# Patient Record
Sex: Male | Born: 1955 | Race: White | Hispanic: No | Smoking: Never smoker
Health system: Southern US, Community
[De-identification: ages and names within clinical notes are randomized; demographics above are authoritative.]

## PROBLEM LIST (undated history)

## (undated) DIAGNOSIS — G4730 Sleep apnea, unspecified: Secondary | ICD-10-CM

## (undated) DIAGNOSIS — R0989 Other specified symptoms and signs involving the circulatory and respiratory systems: Secondary | ICD-10-CM

## (undated) DIAGNOSIS — K219 Gastro-esophageal reflux disease without esophagitis: Secondary | ICD-10-CM

## (undated) DIAGNOSIS — I10 Essential (primary) hypertension: Secondary | ICD-10-CM

## (undated) DIAGNOSIS — E119 Type 2 diabetes mellitus without complications: Secondary | ICD-10-CM

## (undated) DIAGNOSIS — Z972 Presence of dental prosthetic device (complete) (partial): Secondary | ICD-10-CM

## (undated) DIAGNOSIS — M1990 Unspecified osteoarthritis, unspecified site: Secondary | ICD-10-CM

## (undated) HISTORY — PX: OTHER SURGICAL HISTORY: SHX169

---

## 1993-11-04 DIAGNOSIS — R569 Unspecified convulsions: Secondary | ICD-10-CM

## 1993-11-04 DIAGNOSIS — I671 Cerebral aneurysm, nonruptured: Secondary | ICD-10-CM

## 1993-11-04 HISTORY — DX: Cerebral aneurysm, nonruptured: I67.1

## 1993-11-04 HISTORY — DX: Unspecified convulsions: R56.9

## 1994-11-04 HISTORY — PX: ANEURYSM COILING: SHX5349

## 2000-01-02 ENCOUNTER — Encounter: Payer: Self-pay | Admitting: Emergency Medicine

## 2000-01-02 ENCOUNTER — Inpatient Hospital Stay (HOSPITAL_COMMUNITY): Admission: EM | Admit: 2000-01-02 | Discharge: 2000-01-16 | Payer: Self-pay | Admitting: Emergency Medicine

## 2000-01-03 ENCOUNTER — Encounter: Payer: Self-pay | Admitting: Neurosurgery

## 2000-01-04 ENCOUNTER — Encounter: Payer: Self-pay | Admitting: Neurosurgery

## 2000-01-10 ENCOUNTER — Encounter: Payer: Self-pay | Admitting: Neurosurgery

## 2000-03-28 ENCOUNTER — Ambulatory Visit (HOSPITAL_COMMUNITY): Admission: RE | Admit: 2000-03-28 | Discharge: 2000-03-28 | Payer: Self-pay | Admitting: Neurosurgery

## 2000-03-28 ENCOUNTER — Encounter: Payer: Self-pay | Admitting: Neurosurgery

## 2001-04-28 IMAGING — XA IR ANGIO/VERTEBRAL*R*
1 series · 12 of 24 positions shown · non-contrast
Comparison: none

FINDINGS
CLINICAL DATA: PATIENT WITH HEADACHES.  PREVIOUS HISTORY OF ENDOVASCULAR  OCCLUSION OF THE RIGHT
PARACAVERNOUS ANEURYSM.
CEREBRAL ARTERIOGRAM:
FOLLOWING A FULL EXPLANATION OF THE PROCEDURE ALONG WITH THE POTENTIALLY ASSOCIATED COMPLICATIONS,
AN INFORMED WITNESSED CONSENT WAS OBTAINED.  THE RIGHT GROIN WAS PREPPED AND DRAPED IN THE USUAL
STERILE FASHION.  THEREAFTER USING A MODIFIED SELDINGER TECHNIQUE, TRANSFEMORAL ACCESS INTO THE
RIGHT COMMON FEMORAL ARTERY WAS OBTAINED WITHOUT DIFFICULTY.  A 6-FRENCH PINNACLE SHEATH WAS THEN
INSERTED.  THROUGH THIS AND OVER A 0.035 INCH GUIDE WIRE, A 5-FRENCH JB-1 CATHETER WAS ADVANCED
INTO THE AORTIC ARCH REGION AND SELECTIVE CANNULATION ARTERIOGRAMS WERE PERFORMED OF THE RIGHT
COMMON CAROTID ARTERY, THE RIGHT VERTEBRAL ARTERY AND THE LEFT COMMON CAROTID ARTERY.  THERE WERE
NO ACUTE COMPLICATIONS AND THE PATIENT TOLERATED THE PROCEDURE WELL.
MEDICATIONS UTILIZED:  VERSED 1 MG IV.

[Series 1: run · 12 of 116 slices shown]
[im 6/116]
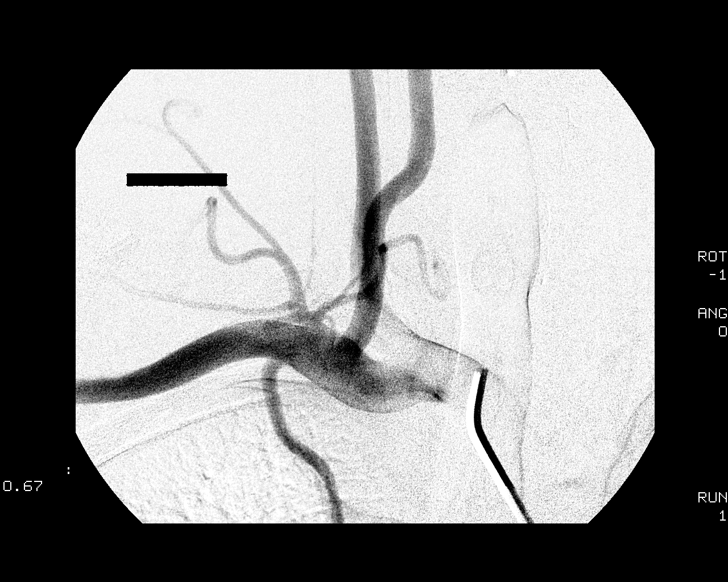
[im 16/116]
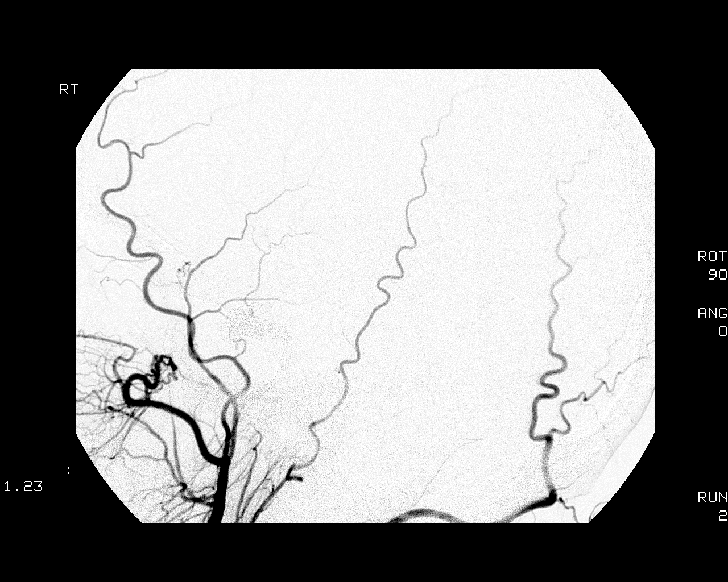
[im 26/116]
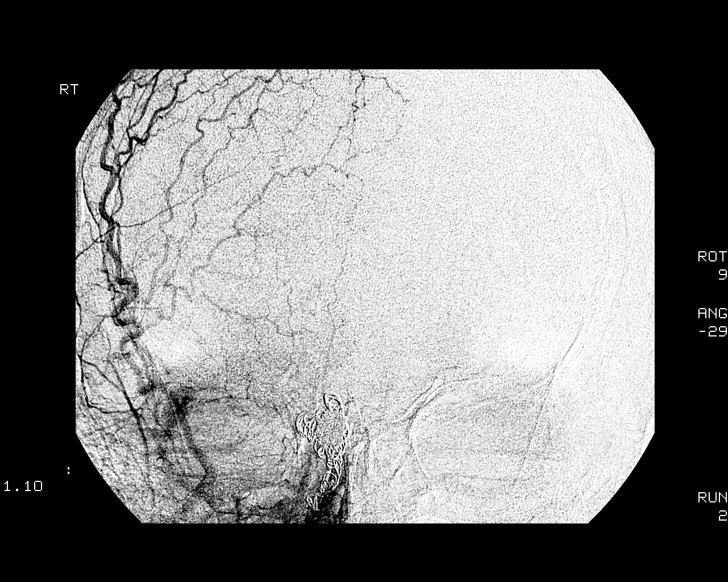
[im 36/116]
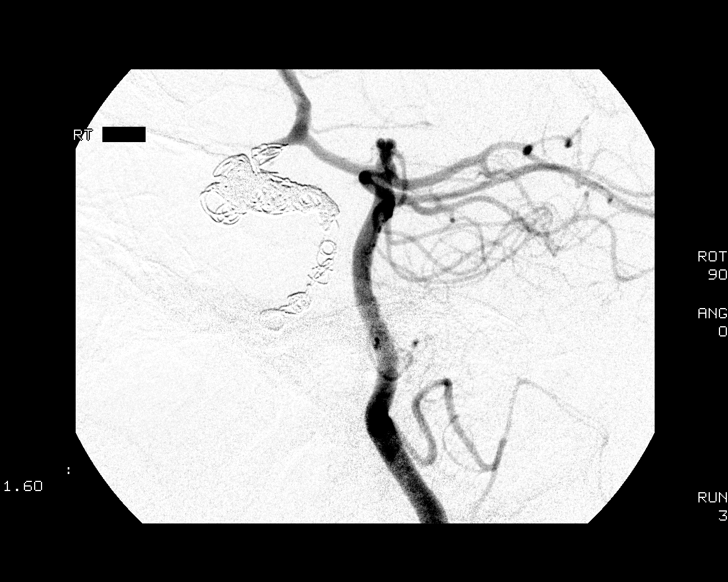
[im 46/116]
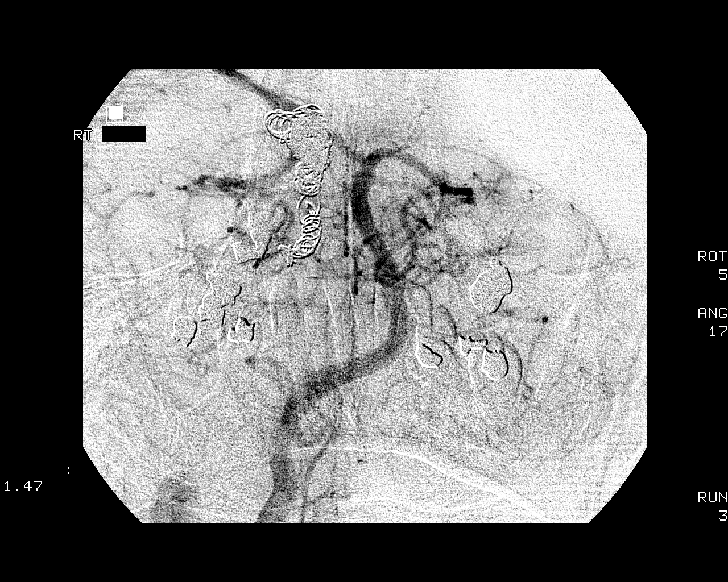
[im 56/116]
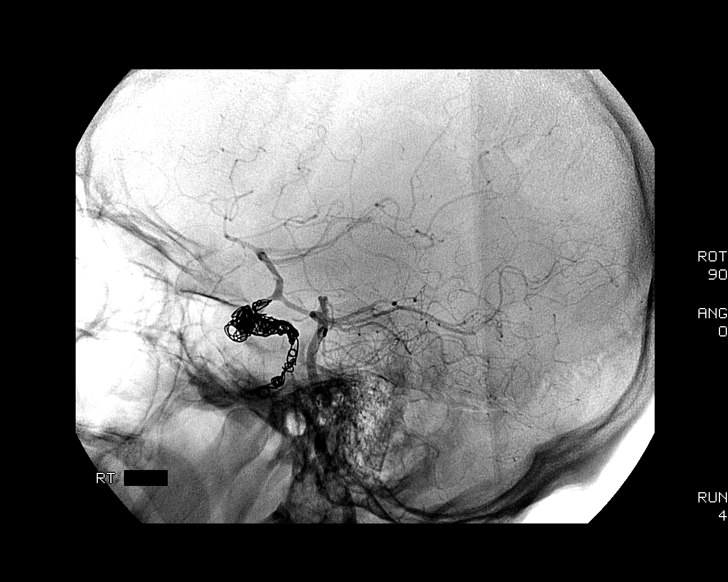
[im 66/116]
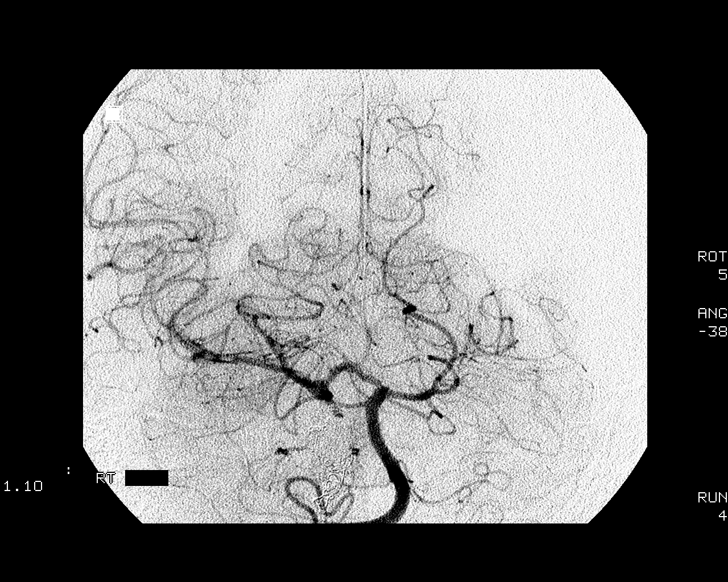
[im 76/116]
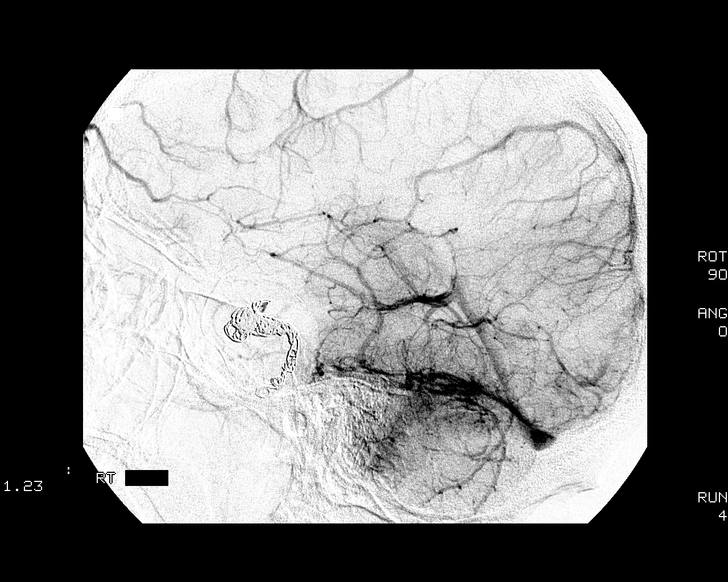
[im 86/116]
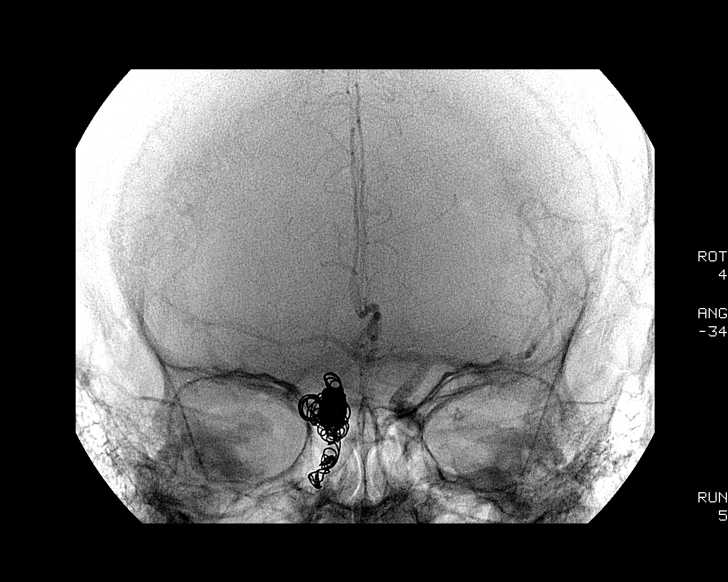
[im 96/116]
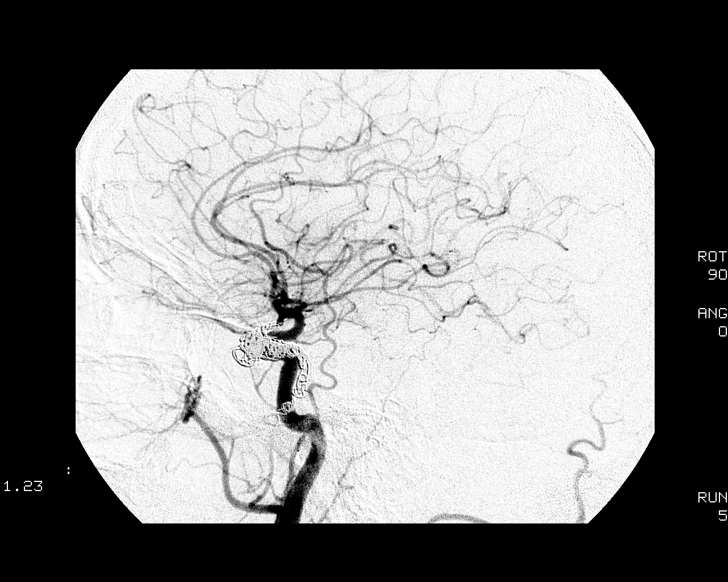
[im 106/116]
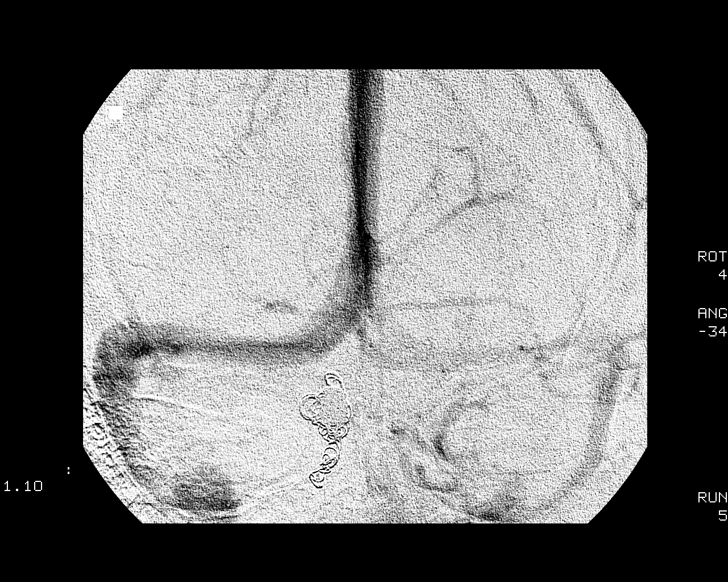
[im 116/116]
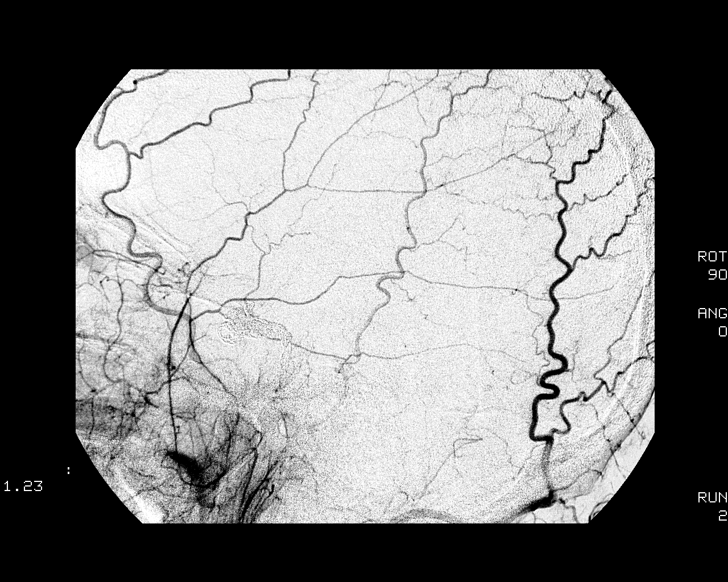

[12 of 24 positions shown; findings below may reference images not displayed]

FINDINGS: THE RIGHT COMMON CAROTID ARTERIOGRAM DEMONSTRATES COMPLETE OCCLUSION OF THE RIGHT
INTERNAL CAROTID ARTERY.   CAROTID ARTERY ORIGIN AND BRANCHES ARE NORMALLY OPACIFIED.  THERE IS NO
RETROGRADE OPACIFICATION VIA THE IPSILATERAL OPHTHALMIC ARTERY OF THE CAVERNOUS SEGMENT OF THE
RIGHT INTERNAL CAROTID ARTERY.
THE RIGHT VERTEBRAL ARTERY ORIGIN IS NORMAL.  THE VESSEL ASCENDS NORMALLY THROUGH THE CRANIAL SKULL
BASE.  THE BASILAR ARTERY, THE POSTERIOR CEREBRAL ARTERIES AND SUPERIOR CEREBRAL ARTERIES AND THE
ANTERIOR INFERIOR CEREBELLAR ARTERIES ARE NORMALLY OPACIFIED INTO THE CAPILLARY AND THE VENOUS
PHASES.  THERE IS A LARGE RIGHT P-COM WHICH RETROGRADELY OPACIFIES THE SUPRACLINOID SEGMENT OF THE
RIGHT ICA AND SUBSEQUENTLY THE RIGHT MCA AND ACA TERRITORIES.  THERE IS RETROGRADE OPACIFICATION TO
THE LEVEL OF THE OPHTHALMIC ARTERY.  NO OPACIFICATION IS NOTED OF THE ANEURYSM OR OF THE MORE
PROXIMAL OCCLUDED INTERNAL CAROTID ARTERY WITH THE COILS.
THE LEFT COMMON CAROTID ARTERIOGRAM DEMONSTRATES THE LEFT MIDDLE AND THE LEFT ANTERIOR CEREBRAL
ARTERIES TO BE NORMALLY OPACIFIED INTO THE CAPILLARY AND THE VENOUS PHASES.  THERE IS CROSS
OPACIFICATION VIA THE A-COM OF THE RIGHT MCA AND ACA TERRITORIES  SIMULTANEOUS WITH OPACIFICATION
OF THE LEFT ACA AND THE LEFT MCA.  NO RETROGRADE OPACIFICATION OF THE SUPRACLINOID SEGMENT ON THE
RIGHT IS NOTED.
IMPRESSION
1.  STATUS POST ENDOVASCULAR OCCLUSION OF THE RIGHT CAVERNOUS AND PETROUS ICA, RIGHT PARACAVERNOUS
ANEURYSM WITH COILS WITHOUT ANGIOGRAPHIC OPACIFICATION OF THE OCCLUDED SEGMENTS OF THE VESSEL AND
THE ANEURYSM.
2.  RETROGRADE OPACIFICATION OF THE OPHTHALMIC ARTERY FROM THE RIGHT VERTEBRAL ARTERY VIA THE P-COM.
3.  RIGHT CEREBRAL HEMISPHERE OPACIFIED VIA THE P-COM AND THE A-COM AS DESCRIBED ABOVE.

## 2007-01-14 ENCOUNTER — Emergency Department: Payer: Self-pay | Admitting: Emergency Medicine

## 2007-01-14 ENCOUNTER — Other Ambulatory Visit: Payer: Self-pay

## 2008-02-14 IMAGING — CR DG CHEST 2V
1 series · 2 of 2 positions shown · non-contrast
Comparison: none

REASON FOR EXAM: Shortness of breath,  rm waiting
COMMENTS:

PROCEDURE:     DXR - DXR CHEST PA (OR AP) AND LATERAL  - January 14, 2007  [DATE]
RESULT:     The lung fields are clear. The heart, mediastinal and osseous
structures show no significant abnormalities.

[Series 1: view not recorded · 0.17mm/px · 2 of 2 slices shown]
[im 1/2]
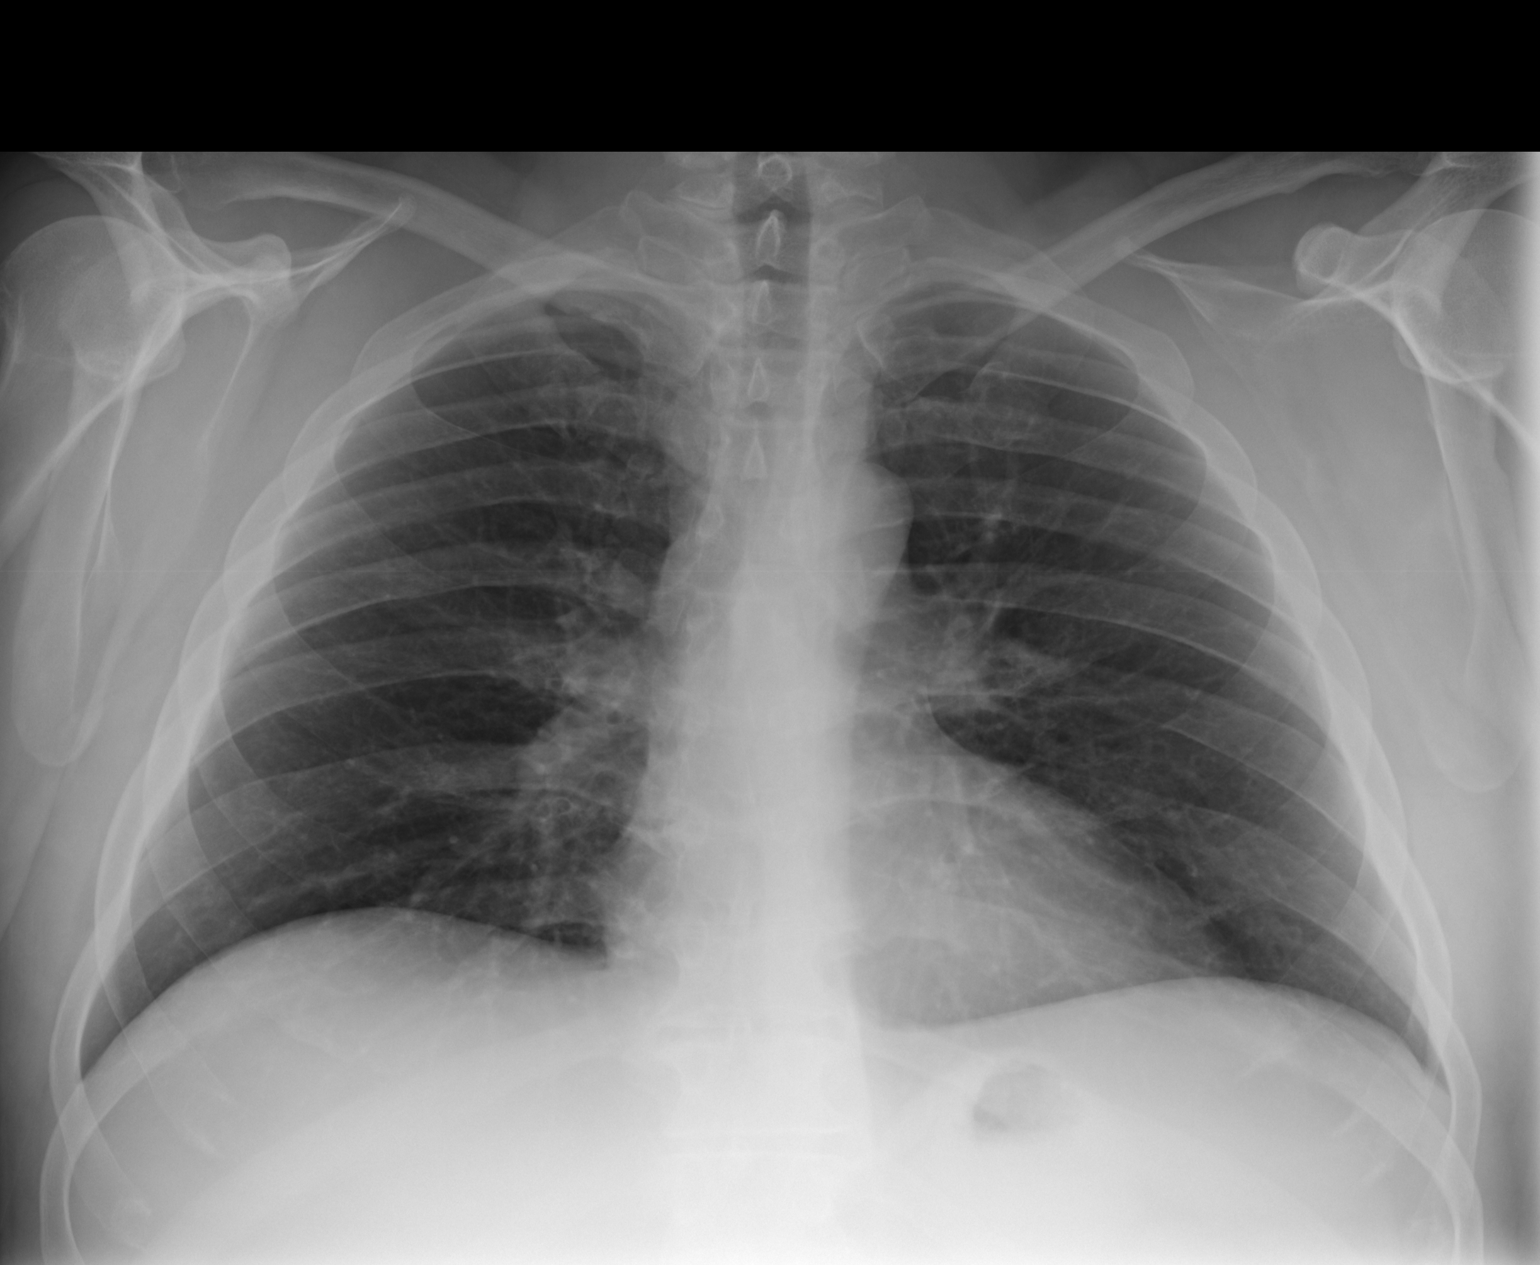
[im 2/2]
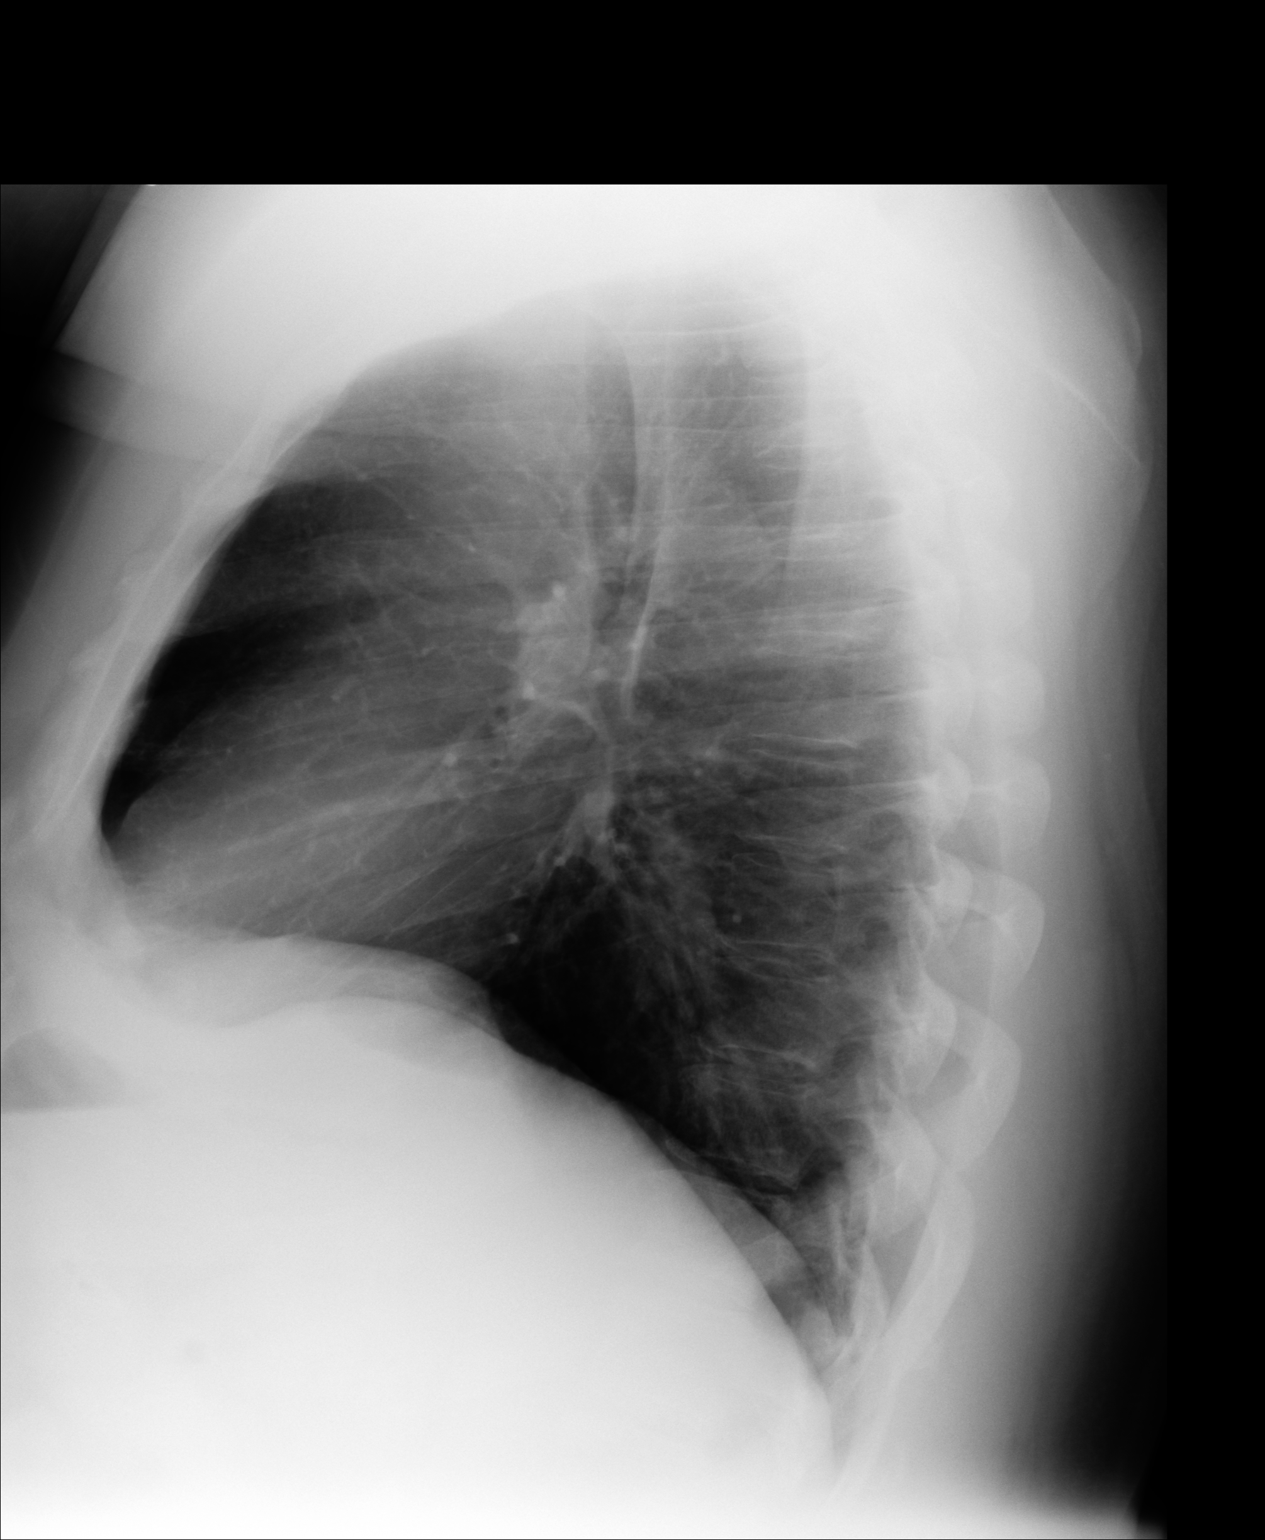

[2 of 2 positions shown; findings below may reference images not displayed]

IMPRESSION: 1.     No acute changes are identified.

## 2013-07-26 LAB — HEMOGLOBIN A1C: Hemoglobin A1C: 10.4

## 2014-08-17 LAB — HEMOGLOBIN A1C: HEMOGLOBIN A1C: 11.9

## 2014-08-18 LAB — COMP. METABOLIC PANEL (12)
ALT: 33 — AB (ref 3–30)
AST: 25
Albumin Serum: 4.1
Albumin/Globulin Ratio: 1.3
Alkaline Phosphatase: 88
BUN/Creatinine Ratio: 12
BUN: 12 (ref 4–21)
CALCIUM: 10
Carbon Dioxide, Total: 24
Chloride, Serum: 96
Creatinine, Ser: 0.99 (ref 0.50–1.10)
EGFR (African American): 98
EGFR (Non-African Amer.): 85
Globulin, Total: 3.1
Glucose: 305
Potassium, serum: 4.6
Sodium, serum: 136
Total Bilirubin: 0.2
Total Protein: 7.2 g/dL

## 2014-08-18 LAB — LIPID PANEL
Cholesterol, Total: 246
HDL Cholesterol: 36 (ref 35–70)
LDL Cholesterol (Calc): 139
Triglycerides: 355 — AB (ref 40–160)
VLDL Cholesterol Cal: 71

## 2015-05-31 LAB — HEMOGLOBIN A1C: HEMOGLOBIN A1C: 10.2

## 2015-07-18 ENCOUNTER — Encounter: Payer: Self-pay | Admitting: Emergency Medicine

## 2015-07-18 ENCOUNTER — Ambulatory Visit
Admission: EM | Admit: 2015-07-18 | Discharge: 2015-07-18 | Disposition: A | Discharge status: Home / Self Care | Payer: Self-pay | Attending: Family Medicine | Admitting: Family Medicine

## 2015-07-18 DIAGNOSIS — Z024 Encounter for examination for driving license: Secondary | ICD-10-CM

## 2015-07-18 DIAGNOSIS — Z021 Encounter for pre-employment examination: Secondary | ICD-10-CM

## 2015-07-18 LAB — DEPT OF TRANSP DIPSTICK, URINE (ARMC ONLY)
Glucose, UA: NEGATIVE mg/dL
Protein, ur: >300 mg/dL — AB
Specific Gravity, Urine: >1.03 — ABNORMAL HIGH (ref 1.005–1.030)

## 2015-07-18 NOTE — ED Provider Notes (Addendum)
CSN: [id number]     Arrival date & time 07/18/15  1407 History   First MD Initiated Contact with Patient 07/18/15 1441     Chief Complaint  Patient presents with  . Employment Physical   (Consider location/radiation/quality/duration/timing/severity/associated sxs/prior Treatment) HPI  Patient is here for DOT. He has a history of aneurysm repair over 18 years ago in [place] precooling. 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  History reviewed. No pertinent past medical history. Past Surgical History  Procedure Laterality Date  . Intercranial coiling     History reviewed. No pertinent family history. Social History  Substance Use Topics  . Smoking status: Never Smoker   . Smokeless tobacco: None  . Alcohol Use: Yes    Review of Systems  Allergies  Codeine  Home Medications   Prior to Admission medications   Not on File   Meds Ordered and Administered this Visit  Medications - No data to display  BP 118/62 mmHg  Pulse 79  Temp(Src) 98.4 F (36.9 C) (Tympanic)  Resp 18  Ht 5' 5.2" (1.656 m)  Wt 240 lb (108.863 kg)  BMI 39.70 kg/m2  SpO2 99% No data found.   Physical Exam  Constitutional: He is oriented to person, place, and time. He appears well-developed and well-nourished.  HENT:  Head: Normocephalic.  Eyes: Pupils are equal, round, and reactive to light.  Neck: Normal range of motion. Neck supple.  Cardiovascular: Normal rate.   Pulmonary/Chest: Effort normal.  Abdominal: Soft. Bowel sounds are normal.  Genitourinary: Penis normal.  Musculoskeletal: Normal range of motion.  Neurological: He is alert and oriented to person, place, and time.  Skin: Skin is warm.  Multiple tattoos present  Psychiatric: He has a normal mood and affect.  Vitals reviewed.   ED Course  Procedures (including critical care time)  Labs Review Labs  Reviewed  DEPT OF TRANSP DIPSTICK, URINE([facility] ONLY) - Abnormal; Notable for the following:    Protein, ur >300 (*)    Specific Gravity, Urine >1.030 (*)    Hgb urine dipstick TRACE (*)    All other components within normal limits    Imaging Review No results found.   Visual Acuity Review  Right Eye Distance:   Left Eye Distance:   Bilateral Distance:    Right Eye Near:   Left Eye Near:    Bilateral Near:         MDM   1. Encounter for commercial driver medical examination (CDME)     Patient was informed that he does have a prescription for mild protein in his urine and a significantly follow-up with the PCP of his choice. Return 2 years for another DOT examination  Hassan Rowan, MD 07/18/15 1546  Hassan Rowan, MD 07/18/15 1549

## 2015-07-18 NOTE — ED Notes (Signed)
Dot physical 

## 2015-07-18 NOTE — Discharge Instructions (Signed)
Preventive Care for Adults A healthy lifestyle and preventive care can promote health and wellness. Preventive health guidelines for men include the following key practices:  A routine yearly physical is a good way to check with your health care provider about your health and preventative screening. It is a chance to share any concerns and updates on your health and to receive a thorough exam.  Visit your dentist for a routine exam and preventative care every 6 months. Brush your teeth twice a day and floss once a day. Good oral hygiene prevents tooth decay and gum disease.  The frequency of eye exams is based on your age, health, family medical history, use of contact lenses, and other factors. Follow your health care provider's recommendations for frequency of eye exams.  Eat a healthy diet. Foods such as vegetables, fruits, whole grains, low-fat dairy products, and lean protein foods contain the nutrients you need without too many calories. Decrease your intake of foods high in solid fats, added sugars, and salt. Eat the right amount of calories for you.Get information about a proper diet from your health care provider, if necessary.  Regular physical exercise is one of the most important things you can do for your health. Most adults should get at least 150 minutes of moderate-intensity exercise (any activity that increases your heart rate and causes you to sweat) each week. In addition, most adults need muscle-strengthening exercises on 2 or more days a week.  Maintain a healthy weight. The body mass index (BMI) is a screening tool to identify possible weight problems. It provides an estimate of body fat based on height and weight. Your health care provider can find your BMI and can help you achieve or maintain a healthy weight.For adults 20 years and older:  A BMI below 18.5 is considered underweight.  A BMI of 18.5 to 24.9 is normal.  A BMI of 25 to 29.9 is considered overweight.  A BMI  of 30 and above is considered obese.  Maintain normal blood lipids and cholesterol levels by exercising and minimizing your intake of saturated fat. Eat a balanced diet with plenty of fruit and vegetables. Blood tests for lipids and cholesterol should begin at age 76 and be repeated every 5 years. If your lipid or cholesterol levels are high, you are over 50, or you are at high risk for heart disease, you may need your cholesterol levels checked more frequently.Ongoing high lipid and cholesterol levels should be treated with medicines if diet and exercise are not working.  If you smoke, find out from your health care provider how to quit. If you do not use tobacco, do not start.  Lung cancer screening is recommended for adults aged 48-80 years who are at high risk for developing lung cancer because of a history of smoking. A yearly low-dose CT scan of the lungs is recommended for people who have at least a 30-pack-year history of smoking and are a current smoker or have quit within the past 15 years. A pack year of smoking is smoking an average of 1 pack of cigarettes a day for 1 year (for example: 1 pack a day for 30 years or 2 packs a day for 15 years). Yearly screening should continue until the smoker has stopped smoking for at least 15 years. Yearly screening should be stopped for people who develop a health problem that would prevent them from having lung cancer treatment.  If you choose to drink alcohol, do not have more than  2 drinks per day. One drink is considered to be 12 ounces (355 mL) of beer, 5 ounces (148 mL) of wine, or 1.5 ounces (44 mL) of liquor.  Avoid use of street drugs. Do not share needles with anyone. Ask for help if you need support or instructions about stopping the use of drugs.  High blood pressure causes heart disease and increases the risk of stroke. Your blood pressure should be checked at least every 1-2 years. Ongoing high blood pressure should be treated with  medicines, if weight loss and exercise are not effective.  If you are 45-79 years old, ask your health care provider if you should take aspirin to prevent heart disease.  Diabetes screening involves taking a blood sample to check your fasting blood sugar level. This should be done once every 3 years, after age 45, if you are within normal weight and without risk factors for diabetes. Testing should be considered at a younger age or be carried out more frequently if you are overweight and have at least 1 risk factor for diabetes.  Colorectal cancer can be detected and often prevented. Most routine colorectal cancer screening begins at the age of 50 and continues through age 75. However, your health care provider may recommend screening at an earlier age if you have risk factors for colon cancer. On a yearly basis, your health care provider may provide home test kits to check for hidden blood in the stool. Use of a small camera at the end of a tube to directly examine the colon (sigmoidoscopy or colonoscopy) can detect the earliest forms of colorectal cancer. Talk to your health care provider about this at age 50, when routine screening begins. Direct exam of the colon should be repeated every 5-10 years through age 75, unless early forms of precancerous polyps or small growths are found.  People who are at an increased risk for hepatitis B should be screened for this virus. You are considered at high risk for hepatitis B if:  You were born in a country where hepatitis B occurs often. Talk with your health care provider about which countries are considered high risk.  Your parents were born in a high-risk country and you have not received a shot to protect against hepatitis B (hepatitis B vaccine).  You have HIV or AIDS.  You use needles to inject street drugs.  You live with, or have sex with, someone who has hepatitis B.  You are a man who has sex with other men (MSM).  You get hemodialysis  treatment.  You take certain medicines for conditions such as cancer, organ transplantation, and autoimmune conditions.  Hepatitis C blood testing is recommended for all people born from 1945 through 1965 and any individual with known risks for hepatitis C.  Practice safe sex. Use condoms and avoid high-risk sexual practices to reduce the spread of sexually transmitted infections (STIs). STIs include gonorrhea, chlamydia, syphilis, trichomonas, herpes, HPV, and human immunodeficiency virus (HIV). Herpes, HIV, and HPV are viral illnesses that have no cure. They can result in disability, cancer, and death.  If you are at risk of being infected with HIV, it is recommended that you take a prescription medicine daily to prevent HIV infection. This is called preexposure prophylaxis (PrEP). You are considered at risk if:  You are a man who has sex with other men (MSM) and have other risk factors.  You are a heterosexual man, are sexually active, and are at increased risk for HIV infection.    infection.  You take drugs by injection.  You are sexually active with a partner who has HIV.  Talk with your health care provider about whether you are at high risk of being infected with HIV. If you choose to begin PrEP, you should first be tested for HIV. You should then be tested every 3 months for as long as you are taking PrEP.  A one-time screening for abdominal aortic aneurysm (AAA) and surgical repair of large AAAs by ultrasound are recommended for men ages 72 to 53 years who are current or former smokers.  Healthy men should no longer receive prostate-specific antigen (PSA) blood tests as part of routine cancer screening. Talk with your health care provider about prostate cancer screening.  Testicular cancer screening is not recommended for adult males who have no symptoms. Screening includes self-exam, a health care provider exam, and other screening tests. Consult with your health care provider about any symptoms  you have or any concerns you have about testicular cancer.  Use sunscreen. Apply sunscreen liberally and repeatedly throughout the day. You should seek shade when your shadow is shorter than you. Protect yourself by wearing long sleeves, pants, a wide-brimmed hat, and sunglasses year round, whenever you are outdoors.  Once a month, do a whole-body skin exam, using a mirror to look at the skin on your back. Tell your health care provider about new moles, moles that have irregular borders, moles that are larger than a pencil eraser, or moles that have changed in shape or color.  Stay current with required vaccines (immunizations).  Influenza vaccine. All adults should be immunized every year.  Tetanus, diphtheria, and acellular pertussis (Td, Tdap) vaccine. An adult who has not previously received Tdap or who does not know his vaccine status should receive 1 dose of Tdap. This initial dose should be followed by tetanus and diphtheria toxoids (Td) booster doses every 10 years. Adults with an unknown or incomplete history of completing a 3-dose immunization series with Td-containing vaccines should begin or complete a primary immunization series including a Tdap dose. Adults should receive a Td booster every 10 years.  Varicella vaccine. An adult without evidence of immunity to varicella should receive 2 doses or a second dose if he has previously received 1 dose.  Human papillomavirus (HPV) vaccine. Males aged 54-21 years who have not received the vaccine previously should receive the 3-dose series. Males aged 22-26 years may be immunized. Immunization is recommended through the age of 50 years for any male who has sex with males and did not get any or all doses earlier. Immunization is recommended for any person with an immunocompromised condition through the age of 59 years if he did not get any or all doses earlier. During the 3-dose series, the second dose should be obtained 4-8 weeks after the first  dose. The third dose should be obtained 24 weeks after the first dose and 16 weeks after the second dose.  Zoster vaccine. One dose is recommended for adults aged 58 years or older unless certain conditions are present.  Measles, mumps, and rubella (MMR) vaccine. Adults born before 75 generally are considered immune to measles and mumps. Adults born in [birth year] or later should have 1 or more doses of MMR vaccine unless there is a contraindication to the vaccine or there is laboratory evidence of immunity to each of the three diseases. A routine second dose of MMR vaccine should be obtained at least 28 days after the first dose for students  attending postsecondary schools, health care workers, or international travelers. People who received inactivated measles vaccine or an unknown type of measles vaccine during 1963-1967 should receive 2 doses of MMR vaccine. People who received inactivated mumps vaccine or an unknown type of mumps vaccine before 1979 and are at high risk for mumps infection should consider immunization with 2 doses of MMR vaccine. Unvaccinated health care workers born before [birth year] who lack laboratory evidence of measles, mumps, or rubella immunity or laboratory confirmation of disease should consider measles and mumps immunization with 2 doses of MMR vaccine or rubella immunization with 1 dose of MMR vaccine.  Pneumococcal 13-valent conjugate (PCV13) vaccine. When indicated, a person who is uncertain of his immunization history and has no record of immunization should receive the PCV13 vaccine. An adult aged 15 years or older who has certain medical conditions and has not been previously immunized should receive 1 dose of PCV13 vaccine. This PCV13 should be followed with a dose of pneumococcal polysaccharide (PPSV23) vaccine. The PPSV23 vaccine dose should be obtained at least 8 weeks after the dose of PCV13 vaccine. An adult aged 56 years or older who has certain medical conditions and  previously received 1 or more doses of PPSV23 vaccine should receive 1 dose of PCV13. The PCV13 vaccine dose should be obtained 1 or more years after the last PPSV23 vaccine dose.  Pneumococcal polysaccharide (PPSV23) vaccine. When PCV13 is also indicated, PCV13 should be obtained first. All adults aged 37 years and older should be immunized. An adult younger than age 32 years who has certain medical conditions should be immunized. Any person who resides in a nursing home or long-term care facility should be immunized. An adult smoker should be immunized. People with an immunocompromised condition and certain other conditions should receive both PCV13 and PPSV23 vaccines. People with human immunodeficiency virus (HIV) infection should be immunized as soon as possible after diagnosis. Immunization during chemotherapy or radiation therapy should be avoided. Routine use of PPSV23 vaccine is not recommended for American Indians, [place]atives, or people younger than 65 years unless there are medical conditions that require PPSV23 vaccine. When indicated, people who have unknown immunization and have no record of immunization should receive PPSV23 vaccine. One-time revaccination 5 years after the first dose of PPSV23 is recommended for people aged 19-64 years who have chronic kidney failure, nephrotic syndrome, asplenia, or immunocompromised conditions. People who received 1-2 doses of PPSV23 before age 2 years should receive another dose of PPSV23 vaccine at age 18 years or later if at least 5 years have passed since the previous dose. Doses of PPSV23 are not needed for people immunized with PPSV23 at or after age 29 years.  Meningococcal vaccine. Adults with asplenia or persistent complement component deficiencies should receive 2 doses of quadrivalent meningococcal conjugate (MenACWY-D) vaccine. The doses should be obtained at least 2 months apart. Microbiologists working with certain meningococcal bacteria,  [employer] recruits, people at risk during an outbreak, and people who travel to or live in countries with a high rate of meningitis should be immunized. A first-year college student up through age 15 years who is living in a residence hall should receive a dose if he did not receive a dose on or after his 16th birthday. Adults who have certain high-risk conditions should receive one or more doses of vaccine.  Hepatitis A vaccine. Adults who wish to be protected from this disease, have certain high-risk conditions, work with hepatitis A-infected animals, work in hepatitis A research  travel to or work in countries with a high rate of hepatitis A should be immunized. Adults who were previously unvaccinated and who anticipate close contact with an international adoptee during the first 60 days after arrival in the [place] from a country with a high rate of hepatitis A should be immunized.  Hepatitis B vaccine. Adults should be immunized if they wish to be protected from this disease, have certain high-risk conditions, may be exposed to blood or other infectious body fluids, are household contacts or sex partners of hepatitis B positive people, are clients or workers in certain care facilities, or travel to or work in countries with a high rate of hepatitis B.  Haemophilus influenzae type b (Hib) vaccine. A previously unvaccinated person with asplenia or sickle cell disease or having a scheduled splenectomy should receive 1 dose of Hib vaccine. Regardless of previous immunization, a recipient of a hematopoietic stem cell transplant should receive a 3-dose series 6-12 months after his successful transplant. Hib vaccine is not recommended for adults with HIV infection. Preventive Service / Frequency Ages 81 to 7  Blood pressure check.** / Every 1 to 2 years.  Lipid and cholesterol check.** / Every 5 years beginning at age 49.  Hepatitis C blood test.** / For any individual with known risks for  hepatitis C.  Skin self-exam. / Monthly.  Influenza vaccine. / Every year.  Tetanus, diphtheria, and acellular pertussis (Tdap, Td) vaccine.** / Consult your health care provider. 1 dose of Td every 10 years.  Varicella vaccine.** / Consult your health care provider.  HPV vaccine. / 3 doses over 6 months, if 67 or younger.  Measles, mumps, rubella (MMR) vaccine.** / You need at least 1 dose of MMR if you were born in 1957 or later. You may also need a second dose.  Pneumococcal 13-valent conjugate (PCV13) vaccine.** / Consult your health care provider.  Pneumococcal polysaccharide (PPSV23) vaccine.** / 1 to 2 doses if you smoke cigarettes or if you have certain conditions.  Meningococcal vaccine.** / 1 dose if you are age 28 to 74 years and a Market researcher living in a residence hall, or have one of several medical conditions. You may also need additional booster doses.  Hepatitis A vaccine.** / Consult your health care provider.  Hepatitis B vaccine.** / Consult your health care provider.  Haemophilus influenzae type b (Hib) vaccine.** / Consult your health care provider. Ages 35 to 13  Blood pressure check.** / Every 1 to 2 years.  Lipid and cholesterol check.** / Every 5 years beginning at age 12.  Lung cancer screening. / Every year if you are aged 35-80 years and have a 30-pack-year history of smoking and currently smoke or have quit within the past 15 years. Yearly screening is stopped once you have quit smoking for at least 15 years or develop a health problem that would prevent you from having lung cancer treatment.  Fecal occult blood test (FOBT) of stool. / Every year beginning at age 60 and continuing until age 42. You may not have to do this test if you get a colonoscopy every 10 years.  Flexible sigmoidoscopy** or colonoscopy.** / Every 5 years for a flexible sigmoidoscopy or every 10 years for a colonoscopy beginning at age 83 and continuing until age  50.  Hepatitis C blood test.** / For all people born from [birth year] through 1965 and any individual with known risks for hepatitis C.  Skin self-exam. / Monthly.  Influenza vaccine. / Every  year.  Tetanus, diphtheria, and acellular pertussis (Tdap/Td) vaccine.** / Consult your health care provider. 1 dose of Td every 10 years.  Varicella vaccine.** / Consult your health care provider.  Zoster vaccine.** / 1 dose for adults aged 60 years or older.  Measles, mumps, rubella (MMR) vaccine.** / You need at least 1 dose of MMR if you were born in 1957 or later. You may also need a second dose.  Pneumococcal 13-valent conjugate (PCV13) vaccine.** / Consult your health care provider.  Pneumococcal polysaccharide (PPSV23) vaccine.** / 1 to 2 doses if you smoke cigarettes or if you have certain conditions.  Meningococcal vaccine.** / Consult your health care provider.  Hepatitis A vaccine.** / Consult your health care provider.  Hepatitis B vaccine.** / Consult your health care provider.  Haemophilus influenzae type b (Hib) vaccine.** / Consult your health care provider. Ages 65 and over  Blood pressure check.** / Every 1 to 2 years.  Lipid and cholesterol check.**/ Every 5 years beginning at age 20.  Lung cancer screening. / Every year if you are aged 55-80 years and have a 30-pack-year history of smoking and currently smoke or have quit within the past 15 years. Yearly screening is stopped once you have quit smoking for at least 15 years or develop a health problem that would prevent you from having lung cancer treatment.  Fecal occult blood test (FOBT) of stool. / Every year beginning at age 50 and continuing until age 75. You may not have to do this test if you get a colonoscopy every 10 years.  Flexible sigmoidoscopy** or colonoscopy.** / Every 5 years for a flexible sigmoidoscopy or every 10 years for a colonoscopy beginning at age 50 and continuing until age 75.  Hepatitis C blood  test.** / For all people born from 1945 through 1965 and any individual with known risks for hepatitis C.  Abdominal aortic aneurysm (AAA) screening.** / A one-time screening for ages 65 to 75 years who are current or former smokers.  Skin self-exam. / Monthly.  Influenza vaccine. / Every year.  Tetanus, diphtheria, and acellular pertussis (Tdap/Td) vaccine.** / 1 dose of Td every 10 years.  Varicella vaccine.** / Consult your health care provider.  Zoster vaccine.** / 1 dose for adults aged 60 years or older.  Pneumococcal 13-valent conjugate (PCV13) vaccine.** / Consult your health care provider.  Pneumococcal polysaccharide (PPSV23) vaccine.** / 1 dose for all adults aged 65 years and older.  Meningococcal vaccine.** / Consult your health care provider.  Hepatitis A vaccine.** / Consult your health care provider.  Hepatitis B vaccine.** / Consult your health care provider.  Haemophilus influenzae type b (Hib) vaccine.** / Consult your health care provider. **Family history and personal history of risk and conditions may change your health care provider's recommendations. Document Released: 12/17/2001 Document Revised: 10/26/2013 Document Reviewed: 03/18/2011 [employer] Patient Information 2015 [employer]. This information is not intended to replace advice given to you by your health care provider. Make sure you discuss any questions you have with your health care provider.  

## 2016-07-24 LAB — HEMOGLOBIN A1C: Hemoglobin A1C: 8.2

## 2016-07-24 LAB — MICROALBUMIN, URINE: MICROALB UR: 150

## 2016-07-25 LAB — COMP. METABOLIC PANEL (12)
ALT: 27 (ref 3–30)
AST: 21
Albumin Serum: 4
Albumin/Globulin Ratio: 1.4
Alkaline Phosphatase: 74
BUN/Creatinine Ratio: 13
BUN: 11 (ref 4–21)
Calcium, Ser: 9.2
Carbon Dioxide, Total: 23
Chloride, Serum: 98
Creatinine, Ser: 0.87 (ref 0.50–1.10)
EGFR (African American): 110
EGFR (Non-African Amer.): 95
Globulin, Total: 2.9
Glucose: 234
Potassium, serum: 3.8
Sodium, serum: 137
Total Bilirubin: 0.2
Total Protein: 6.9 g/dL

## 2016-07-25 LAB — LIPID PANEL
Cholesterol, Total: 218
HDL Cholesterol: 43 (ref 35–70)
LDL Cholesterol (Calc): 127
Triglycerides: 241 — AB (ref 40–160)
VLDL Cholesterol Cal: 48

## 2017-02-24 LAB — HEMOGLOBIN A1C: Hemoglobin A1C: 8.8

## 2017-12-31 DIAGNOSIS — E119 Type 2 diabetes mellitus without complications: Secondary | ICD-10-CM | POA: Insufficient documentation

## 2017-12-31 DIAGNOSIS — I10 Essential (primary) hypertension: Secondary | ICD-10-CM | POA: Insufficient documentation

## 2017-12-31 DIAGNOSIS — I671 Cerebral aneurysm, nonruptured: Secondary | ICD-10-CM | POA: Insufficient documentation

## 2018-04-09 LAB — COMP. METABOLIC PANEL (12)
ALT: 41 — AB (ref 3–30)
AST: 32
Albumin/Globulin Ratio: 1.5
Albumin: 4.3
Alkaline Phosphatase: 77
BUN/Creatinine Ratio: 12
Calcium: 9.7
Carbon Dioxide, Total: 21
Chloride: 99
Creat: 1.2
EGFR (African American): 76
EGFR (Non-African Amer.): 65
Globulin, Total: 2.8
Glucose: 418
Potassium: 4.3
Sodium: 137
Total Bilirubin: 0.4
Total Protein: 7.1 g/dL

## 2018-04-09 LAB — CBC WITH DIFFERENTIAL/PLATELET
Basophils Absolute: 0
Basophils: 1
Eosinophil: 3
Eosinophils Absolute: 0
HCT: 39 (ref 29–41)
Hemoglobin: 12.4
Immature Granulocytes: 0
Lymphocytes absolute: 1.8 10*3/uL (ref 0.1–1.8)
Lymphocytes: 25
MCH: 29
MCHC: 31.6
MCV: 92 (ref 76–111)
Monocyes absolute: 0.3 10*3/uL (ref 0.1–1)
Monocytes: 4
Neutrophils absolute (GR#): 5 10*3/uL (ref 1.7–7.7)
Neutrophils: 67
Platelets: 231
RBC: 4.27
RDW: 13.9
WBC: 7.4

## 2018-04-09 LAB — HEMOGLOBIN A1C: Hemoglobin A1C: 9.1

## 2018-08-12 ENCOUNTER — Ambulatory Visit: Payer: Self-pay

## 2018-08-13 ENCOUNTER — Ambulatory Visit: Payer: Self-pay

## 2018-08-25 ENCOUNTER — Telehealth: Payer: Self-pay | Admitting: Adult Health Nurse Practitioner

## 2018-08-25 NOTE — Telephone Encounter (Signed)
Called and left voicemail to reschedule 10/30 appointment

## 2018-09-02 ENCOUNTER — Ambulatory Visit: Payer: Self-pay | Admitting: Ophthalmology

## 2018-12-30 DIAGNOSIS — T8859XA Other complications of anesthesia, initial encounter: Secondary | ICD-10-CM

## 2018-12-30 HISTORY — PX: RETINAL DETACHMENT SURGERY: SHX105

## 2018-12-30 HISTORY — DX: Other complications of anesthesia, initial encounter: T88.59XA

## 2019-01-21 ENCOUNTER — Other Ambulatory Visit: Payer: Self-pay

## 2019-01-27 ENCOUNTER — Other Ambulatory Visit: Payer: Self-pay

## 2019-04-28 ENCOUNTER — Emergency Department: Payer: Medicaid Other

## 2019-04-28 ENCOUNTER — Other Ambulatory Visit: Payer: Self-pay

## 2019-04-28 ENCOUNTER — Emergency Department
Admission: EM | Admit: 2019-04-28 | Discharge: 2019-04-28 | Disposition: A | Discharge status: Home / Self Care | Payer: Medicaid Other | Attending: Emergency Medicine | Admitting: Emergency Medicine

## 2019-04-28 DIAGNOSIS — T671XXA Heat syncope, initial encounter: Secondary | ICD-10-CM | POA: Diagnosis not present

## 2019-04-28 DIAGNOSIS — E860 Dehydration: Secondary | ICD-10-CM | POA: Insufficient documentation

## 2019-04-28 DIAGNOSIS — R42 Dizziness and giddiness: Secondary | ICD-10-CM | POA: Diagnosis present

## 2019-04-28 LAB — URINALYSIS, COMPLETE (UACMP) WITH MICROSCOPIC
Bacteria, UA: NONE SEEN
Bilirubin Urine: NEGATIVE
Glucose, UA: >=500 mg/dL — AB
Ketones, ur: NEGATIVE mg/dL
Leukocytes,Ua: NEGATIVE
Nitrite: NEGATIVE
Protein, ur: 100 mg/dL — AB
Specific Gravity, Urine: 1.009 (ref 1.005–1.030)
pH: 5 (ref 5.0–8.0)

## 2019-04-28 LAB — BASIC METABOLIC PANEL
Anion gap: 13 (ref 5–15)
BUN: 17 mg/dL (ref 8–23)
CO2: 21 mmol/L — ABNORMAL LOW (ref 22–32)
Calcium: 8.9 mg/dL (ref 8.9–10.3)
Chloride: 102 mmol/L (ref 98–111)
Creatinine, Ser: 1.25 mg/dL — ABNORMAL HIGH (ref 0.61–1.24)
GFR calc Af Amer: >60 mL/min (ref >60)
GFR calc non Af Amer: >60 mL/min (ref >60)
Glucose, Bld: 314 mg/dL — ABNORMAL HIGH (ref 70–99)
Potassium: 4.2 mmol/L (ref 3.5–5.1)
Sodium: 136 mmol/L (ref 135–145)

## 2019-04-28 LAB — CBC
HCT: 35 % — ABNORMAL LOW (ref 39.0–52.0)
Hemoglobin: 12.1 g/dL — ABNORMAL LOW (ref 13.0–17.0)
MCH: 28.9 pg (ref 26.0–34.0)
MCHC: 34.6 g/dL (ref 30.0–36.0)
MCV: 83.5 fL (ref 80.0–100.0)
Platelets: 195 10*3/uL (ref 150–400)
RBC: 4.19 MIL/uL — ABNORMAL LOW (ref 4.22–5.81)
RDW: 12.7 % (ref 11.5–15.5)
WBC: 5.5 10*3/uL (ref 4.0–10.5)
nRBC: 0 % (ref 0.0–0.2)

## 2019-04-28 MED ORDER — SODIUM CHLORIDE 0.9 % IV BOLUS
1000.0000 mL | Freq: Once | INTRAVENOUS | Status: AC
  Administered 2019-04-28: 21:00:00 1000 mL via INTRAVENOUS

## 2019-04-28 MED ORDER — SODIUM CHLORIDE 0.9 % IV BOLUS
1000.0000 mL | Freq: Once | INTRAVENOUS | Status: AC
  Administered 2019-04-28: 1000 mL via INTRAVENOUS

## 2019-04-28 NOTE — Discharge Instructions (Addendum)
Your lab tests and CT scan of the head were okay today.  Please follow-up with your doctor for continued monitoring of your symptoms.  Be sure to avoid prolonged heat exposure during the summer.

## 2019-04-28 NOTE — ED Triage Notes (Signed)
Pt arrived via [facility] from home with dizziness and weakness. Vitals WNL. Hx of DM, HTN, brain anureysm, and cataract surgery.

## 2019-04-28 NOTE — ED Provider Notes (Signed)
[facility] Emergency Department Provider Note  ____________________________________________  Time seen: Approximately 10:42 PM  I have reviewed the triage vital signs and the nursing notes.   HISTORY  Chief Complaint Near Syncope    HPI Angel Freeman is a 63 y.o. 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  This morning he did not take his medications because he left the house too early.  He also did not eat or drink much fluids throughout the day, and spent about 4 hours outside in nearly 90 degrees sunny weather.  During this time, he started to feel lightheaded and weak all over so he tried walking back to the house.  However, on his way back to the house he got so lightheaded that he fell to the ground and apparently passed out.  He was found facedown on the ground in his yard.  He denies any preceding chest pain shortness of breath belly pain or back pain.  He did not have any thunderclap headache vision change paresthesia or weakness, but does note a 3/10 bilateral frontal headache since awakening from the syncope episode.  He is currently feeling better in the emergency department.  No radiating pain.  No identifiable aggravating or alleviating factors.    History reviewed. No pertinent past medical history.   There are no active problems to display for this patient.    Past Surgical History:  Procedure Laterality Date  . intercranial coiling       Prior to Admission medications   Not on File   sodium chloride (SALINE MIST NASL)  into each nostril Two (2) times a day.  0   Active  ALPRAZolam (XANAX) 0.5 MG tablet  Take 1 tablet by mouth two (2) times a day as needed.  0 12/17/2018  Active  amLODIPine (NORVASC) 10 MG tablet  Take 1 tablet by mouth daily.  0 12/04/2018  Active  lisinopril (PRINIVIL,ZESTRIL) 5 MG tablet  Take 2  tablets by mouth daily.  0 12/04/2018  Active  gabapentin (NEURONTIN) 300 MG capsule  Take 1 capsule by mouth Two (2) times a day.  0 12/04/2018  Active  lovastatin (MEVACOR) 40 MG tablet  Take 40 mg by mouth nightly.  0 12/04/2018  Active  metFORMIN (GLUCOPHAGE) 1000 MG tablet  Take 1,000 mg by mouth Two (2) times a day.  0 12/04/2018  Active  sertraline (ZOLOFT) 100 MG tablet  Take 100 mg by mouth daily.  0 12/09/2018  Active  tamsulosin (FLOMAX) 0.4 mg capsule  Take 1 capsule by mouth daily.  0 04/22/2017  Active  SITagliptin (JANUVIA) 100 MG tablet             Allergies Codeine   History reviewed. No pertinent family history.  Social History Social History   Tobacco Use  . Smoking status: Never Smoker  Substance Use Topics  . Alcohol use: Yes  . Drug use: Not on file    Review of Systems  Constitutional:   No fever or chills.  ENT:   No sore throat. No rhinorrhea. Cardiovascular:   No chest pain or syncope. Respiratory:   No dyspnea or cough. Gastrointestinal:   Negative for abdominal pain, vomiting and diarrhea.  Musculoskeletal:   Negative for focal pain or swelling All other systems reviewed and are negative except as documented above in ROS and HPI.  ____________________________________________  PHYSICAL EXAM:  VITAL SIGNS: ED Triage Vitals  Enc Vitals Group     BP 04/28/19 2033 134/83     Pulse Rate 04/28/19 2033 77     Resp 04/28/19 2033 20     Temp 04/28/19 2033 97.9 F (36.6 C)     Temp Source 04/28/19 2033 Oral     SpO2 04/28/19 2033 99 %     Weight 04/28/19 2032 231 lb (104.8 kg)     Height 04/28/19 2032 5\' 5"  (1.651 m)     Head Circumference --      Peak Flow --      Pain Score 04/28/19 2032 0     Pain Loc --      Pain Edu? --      Excl. in GC? --     Vital signs reviewed, nursing assessments reviewed.   Constitutional:   Alert and oriented. Non-toxic appearance. Eyes:   Conjunctivae are normal. EOMI. PERRL. ENT       Head:   Normocephalic and atraumatic.      Nose:   No congestion/rhinnorhea.       Mouth/Throat:   Dry mucous membranes, no pharyngeal erythema. No peritonsillar mass.       Neck:   No meningismus. Full ROM. Hematological/Lymphatic/Immunilogical:   No cervical lymphadenopathy. Cardiovascular:   RRR. Symmetric bilateral radial and DP pulses.  No murmurs. Cap refill less than 2 seconds. Respiratory:   Normal respiratory effort without tachypnea/retractions. Breath sounds are clear and equal bilaterally. No wheezes/rales/rhonchi. Gastrointestinal:   Soft and nontender. Non distended. There is no CVA tenderness.  No rebound, rigidity, or guarding.  Musculoskeletal:   Normal range of motion in all extremities. No joint effusions.  No lower extremity tenderness.  No edema. Neurologic:   Normal speech and language.  Annual nerves III through XII intact Motor grossly intact. No acute focal neurologic deficits are appreciated.  Skin:    Skin is warm, dry and intact. No rash noted.  No petechiae, purpura, or bullae.  ____________________________________________    LABS (pertinent positives/negatives) (all labs ordered are listed, but only abnormal results are displayed) Labs Reviewed  BASIC METABOLIC PANEL - Abnormal; Notable for the following components:      Result Value   CO2 21 (*)    Glucose, Bld 314 (*)    Creatinine, Ser 1.25 (*)    All other components within normal limits  CBC - Abnormal; Notable for the following components:   RBC 4.19 (*)    Hemoglobin 12.1 (*)    HCT 35.0 (*)    All other components within normal limits  URINALYSIS, COMPLETE (UACMP) WITH MICROSCOPIC  CBG MONITORING, ED   ____________________________________________   EKG  Interpreted by me Sinus rhythm rate of 78, normal axis intervals QRS ST segments and T waves  ____________________________________________    RADIOLOGY  Ct Head Wo Contrast  Result Date: 04/28/2019 CLINICAL DATA:  63 year old  male with history of cold brain aneurysm presenting with dizziness. EXAM: CT HEAD WITHOUT CONTRAST TECHNIQUE: Contiguous axial images were obtained from the base of the skull through the vertex without intravenous contrast. COMPARISON:  None. FINDINGS: Evaluation is limited due to streak artifact caused by metallic vascular coil. Brain: The ventricles and sulci appropriate size for patient's age. Cysts the gray-white matter discrimination is preserved. There is no acute intracranial hemorrhage. No mass effect or midline shift. No extra-axial fluid collection. Vascular: Right ICA aneurysm coil with associated streak artifact. Skull: Normal. Negative for fracture or focal  lesion. Sinuses/Orbits: No acute finding. Other: None IMPRESSION: No acute intracranial pathology. Electronically Signed   By: Anner Crete M.D.   On: 04/28/2019 21:49    ____________________________________________   PROCEDURES Procedures  ____________________________________________  DIFFERENTIAL DIAGNOSIS   Intracranial hemorrhage, dehydration, heat syncope  CLINICAL IMPRESSION / ASSESSMENT AND PLAN / ED COURSE  Medications ordered in the ED: Medications  sodium chloride 0.9 % bolus 1,000 mL (0 mLs Intravenous Stopped 04/28/19 2138)  sodium chloride 0.9 % bolus 1,000 mL (0 mLs Intravenous Stopped 04/28/19 2247)    Pertinent labs & imaging results that were available during my care of the patient were reviewed by me and considered in my medical decision making (see chart for details).  Angel Freeman was evaluated in Emergency Department on 04/28/2019 for the symptoms described in the history of present illness. He was evaluated in the context of the global COVID-19 pandemic, which necessitated consideration that the patient might be at risk for infection with the SARS-CoV-2 virus that causes COVID-19. Institutional protocols and algorithms that pertain to the evaluation of patients at risk for COVID-19 are in a state of  rapid change based on information released by regulatory bodies including the CDC and federal and state organizations. These policies and algorithms were followed during the patient's care in the ED.   Patient presents with syncope, history very consistent with dehydration and heat related illness.  Patient feeling better after IV fluids in the ED.  No significant prodromal symptoms.  However with his history of cerebral aneurysm coiling, I obtained a CT scan of the head to evaluate for intracranial hemorrhage which is negative.  After fluids patient feels much better, he is tolerating oral intake, he is ambulatory, and eager to go home.  I think is medically stable to follow-up with his doctor.      ____________________________________________   FINAL CLINICAL IMPRESSION(S) / ED DIAGNOSES    Final diagnoses:  Heat syncope, initial encounter  Dehydration     ED Discharge Orders    None      Portions of this note were generated with dragon dictation software. Dictation errors may occur despite best attempts at proofreading.   Carrie Mew, MD 04/28/19 2248

## 2020-04-20 ENCOUNTER — Other Ambulatory Visit: Payer: Self-pay

## 2020-04-20 ENCOUNTER — Encounter: Payer: Self-pay | Admitting: Ophthalmology

## 2020-04-27 ENCOUNTER — Other Ambulatory Visit
Admission: RE | Admit: 2020-04-27 | Discharge: 2020-04-27 | Disposition: A | Discharge status: Home / Self Care | Payer: Medicare HMO | Source: Ambulatory Visit | Attending: Ophthalmology | Admitting: Ophthalmology

## 2020-04-27 ENCOUNTER — Other Ambulatory Visit: Payer: Self-pay

## 2020-04-27 DIAGNOSIS — Z01812 Encounter for preprocedural laboratory examination: Secondary | ICD-10-CM | POA: Insufficient documentation

## 2020-04-27 DIAGNOSIS — Z20822 Contact with and (suspected) exposure to covid-19: Secondary | ICD-10-CM | POA: Diagnosis not present

## 2020-04-27 LAB — SARS CORONAVIRUS 2 (TAT 6-24 HRS): SARS Coronavirus 2: NEGATIVE

## 2020-04-27 NOTE — Discharge Instructions (Signed)

## 2020-05-01 ENCOUNTER — Other Ambulatory Visit: Payer: Self-pay

## 2020-05-01 ENCOUNTER — Encounter
Admission: RE | Disposition: A | Discharge status: Home / Self Care | Payer: Self-pay | Source: Home / Self Care | Attending: Ophthalmology

## 2020-05-01 ENCOUNTER — Ambulatory Visit: Payer: Medicare HMO | Admitting: Anesthesiology

## 2020-05-01 ENCOUNTER — Ambulatory Visit
Admission: RE | Admit: 2020-05-01 | Discharge: 2020-05-01 | Disposition: A | Discharge status: Home / Self Care | Payer: Medicare HMO | Attending: Ophthalmology | Admitting: Ophthalmology

## 2020-05-01 ENCOUNTER — Encounter: Payer: Self-pay | Admitting: Ophthalmology

## 2020-05-01 DIAGNOSIS — H2512 Age-related nuclear cataract, left eye: Secondary | ICD-10-CM | POA: Insufficient documentation

## 2020-05-01 DIAGNOSIS — E1136 Type 2 diabetes mellitus with diabetic cataract: Secondary | ICD-10-CM | POA: Insufficient documentation

## 2020-05-01 DIAGNOSIS — G4730 Sleep apnea, unspecified: Secondary | ICD-10-CM | POA: Insufficient documentation

## 2020-05-01 DIAGNOSIS — E1140 Type 2 diabetes mellitus with diabetic neuropathy, unspecified: Secondary | ICD-10-CM | POA: Diagnosis not present

## 2020-05-01 DIAGNOSIS — I10 Essential (primary) hypertension: Secondary | ICD-10-CM | POA: Insufficient documentation

## 2020-05-01 DIAGNOSIS — Z79899 Other long term (current) drug therapy: Secondary | ICD-10-CM | POA: Diagnosis not present

## 2020-05-01 DIAGNOSIS — E113512 Type 2 diabetes mellitus with proliferative diabetic retinopathy with macular edema, left eye: Secondary | ICD-10-CM | POA: Diagnosis not present

## 2020-05-01 DIAGNOSIS — Z885 Allergy status to narcotic agent status: Secondary | ICD-10-CM | POA: Diagnosis not present

## 2020-05-01 HISTORY — DX: Presence of dental prosthetic device (complete) (partial): Z97.2

## 2020-05-01 HISTORY — DX: Essential (primary) hypertension: I10

## 2020-05-01 HISTORY — DX: Other specified symptoms and signs involving the circulatory and respiratory systems: R09.89

## 2020-05-01 HISTORY — DX: Type 2 diabetes mellitus without complications: E11.9

## 2020-05-01 HISTORY — DX: Unspecified osteoarthritis, unspecified site: M19.90

## 2020-05-01 HISTORY — DX: Sleep apnea, unspecified: G47.30

## 2020-05-01 HISTORY — DX: Gastro-esophageal reflux disease without esophagitis: K21.9

## 2020-05-01 HISTORY — PX: CATARACT EXTRACTION W/PHACO: SHX586

## 2020-05-01 LAB — GLUCOSE, CAPILLARY
Glucose-Capillary: 160 mg/dL — ABNORMAL HIGH (ref 70–99)
Glucose-Capillary: 175 mg/dL — ABNORMAL HIGH (ref 70–99)

## 2020-05-01 SURGERY — PHACOEMULSIFICATION, CATARACT, WITH IOL INSERTION
Anesthesia: Monitor Anesthesia Care | Site: Eye | Laterality: Left

## 2020-05-01 MED ORDER — TETRACAINE HCL 0.5 % OP SOLN
1.0000 [drp] | OPHTHALMIC | Status: DC | PRN
  Administered 2020-05-01 (×3): 1 [drp] via OPHTHALMIC

## 2020-05-01 MED ORDER — SODIUM HYALURONATE 10 MG/ML IO SOLN
INTRAOCULAR | Status: DC | PRN
  Administered 2020-05-01: 0.55 mL via INTRAOCULAR

## 2020-05-01 MED ORDER — TRIAMCINOLONE ACETONIDE 40 MG/ML IJ SUSP
INTRAMUSCULAR | Status: DC | PRN
  Administered 2020-05-01: .1 mL via INTRAMUSCULAR

## 2020-05-01 MED ORDER — MOXIFLOXACIN HCL 0.5 % OP SOLN
OPHTHALMIC | Status: DC | PRN
  Administered 2020-05-01: 0.2 mL via OPHTHALMIC

## 2020-05-01 MED ORDER — LIDOCAINE HCL (PF) 2 % IJ SOLN
INTRAOCULAR | Status: DC | PRN
  Administered 2020-05-01: 1 mL via INTRAOCULAR

## 2020-05-01 MED ORDER — ARMC OPHTHALMIC DILATING DROPS
1.0000 "application " | OPHTHALMIC | Status: DC | PRN
  Administered 2020-05-01 (×3): 1 via OPHTHALMIC

## 2020-05-01 MED ORDER — SODIUM HYALURONATE 23 MG/ML IO SOLN
INTRAOCULAR | Status: DC | PRN
  Administered 2020-05-01: 0.6 mL via INTRAOCULAR

## 2020-05-01 MED ORDER — MIDAZOLAM HCL 2 MG/2ML IJ SOLN
INTRAMUSCULAR | Status: DC | PRN
  Administered 2020-05-01: 2 mg via INTRAVENOUS

## 2020-05-01 MED ORDER — EPINEPHRINE PF 1 MG/ML IJ SOLN
INTRAOCULAR | Status: DC | PRN
  Administered 2020-05-01: 59 mL via OPHTHALMIC

## 2020-05-01 MED ORDER — LACTATED RINGERS IV SOLN: INTRAVENOUS | Status: DC

## 2020-05-01 SURGICAL SUPPLY — 20 items
CANNULA ANT/CHMB 27G (MISCELLANEOUS) ×2 IMPLANT
CANNULA ANT/CHMB 27GA (MISCELLANEOUS) ×6 IMPLANT
DISSECTOR HYDRO NUCLEUS 50X22 (MISCELLANEOUS) ×3 IMPLANT
GLOVE SURG LX 7.5 STRW (GLOVE) ×2
GLOVE SURG LX STRL 7.5 STRW (GLOVE) ×1 IMPLANT
GLOVE SURG SYN 8.5  E (GLOVE) ×3
GLOVE SURG SYN 8.5 E (GLOVE) ×1 IMPLANT
GLOVE SURG SYN 8.5 PF PI (GLOVE) ×1 IMPLANT
GOWN STRL REUS W/ TWL LRG LVL3 (GOWN DISPOSABLE) ×2 IMPLANT
GOWN STRL REUS W/TWL LRG LVL3 (GOWN DISPOSABLE) ×6
LENS IOL DIOP 22.5 (Intraocular Lens) ×3 IMPLANT
LENS IOL TECNIS MONO 22.5 (Intraocular Lens) IMPLANT
MARKER SKIN DUAL TIP RULER LAB (MISCELLANEOUS) ×3 IMPLANT
PACK DR. KING ARMS (PACKS) ×3 IMPLANT
PACK EYE AFTER SURG (MISCELLANEOUS) ×3 IMPLANT
PACK OPTHALMIC (MISCELLANEOUS) ×3 IMPLANT
SYR 3ML LL SCALE MARK (SYRINGE) ×3 IMPLANT
SYR TB 1ML LUER SLIP (SYRINGE) ×5 IMPLANT
WATER STERILE IRR 250ML POUR (IV SOLUTION) ×3 IMPLANT
WIPE NON LINTING 3.25X3.25 (MISCELLANEOUS) ×3 IMPLANT

## 2020-05-01 NOTE — Anesthesia Postprocedure Evaluation (Signed)
Anesthesia Post Note  Patient: Angel Freeman  Procedure(s) Performed: CATARACT EXTRACTION PHACO AND INTRAOCULAR LENS PLACEMENT (IOC) LEFT INTRAVITREAL KENALOG INJECTION DIABETIC 1.97  00:26.0 (Left Eye)     Patient location during evaluation: PACU Anesthesia Type: MAC Level of consciousness: awake and alert Pain management: pain level controlled Vital Signs Assessment: post-procedure vital signs reviewed and stable Respiratory status: spontaneous breathing Cardiovascular status: blood pressure returned to baseline Postop Assessment: no apparent nausea or vomiting, adequate PO intake and no headache Anesthetic complications: no   No complications documented.  Adele Barthel Jenevieve Kirschbaum

## 2020-05-01 NOTE — Transfer of Care (Signed)
Immediate Anesthesia Transfer of Care Note  Patient: Angel Freeman  Procedure(s) Performed: CATARACT EXTRACTION PHACO AND INTRAOCULAR LENS PLACEMENT (IOC) LEFT INTRAVITREAL KENALOG INJECTION DIABETIC 1.97  00:26.0 (Left Eye)  Patient Location: PACU  Anesthesia Type: MAC  Level of Consciousness: awake, alert  and patient cooperative  Airway and Oxygen Therapy: Patient Spontanous Breathing and Patient connected to supplemental oxygen  Post-op Assessment: Post-op Vital signs reviewed, Patient's Cardiovascular Status Stable, Respiratory Function Stable, Patent Airway and No signs of Nausea or vomiting  Post-op Vital Signs: Reviewed and stable  Complications: No complications documented.

## 2020-05-01 NOTE — Anesthesia Procedure Notes (Signed)
Procedure Name: MAC Date/Time: 05/01/2020 11:12 AM Performed by: Silvana Newness, CRNA Pre-anesthesia Checklist: Patient identified, Emergency Drugs available, Suction available, Patient being monitored and Timeout performed Patient Re-evaluated:Patient Re-evaluated prior to induction Oxygen Delivery Method: Nasal cannula

## 2020-05-01 NOTE — Op Note (Signed)
OPERATIVE NOTE  Angel Freeman [id number] 05/01/2020   PREOPERATIVE DIAGNOSIS:   1.  Nuclear sclerotic cataract left eye.  H25.12 2.  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 CPT 84132 Phacoemusification with posterior chamber intraocular lens placement of the left eye  2.  CPT [id number] Intravitreal injection of kenalog, left eye.  LENS:   Implant Name Type Inv. Item Serial No. Manufacturer Lot No. LRB No. Used Action  LENS IOL DIOP 22.5 - [id number] Intraocular Lens LENS IOL DIOP 22.5 [id number] [employer]  Left 1 Implanted      Procedure(s) with comments: CATARACT EXTRACTION PHACO AND INTRAOCULAR LENS PLACEMENT (IOC) LEFT INTRAVITREAL KENALOG INJECTION DIABETIC 1.97  00:26.0 (Left) - Diabetic - oral meds  DCB00 +22.5   ULTRASOUND TIME: 0 minutes 26 seconds.  CDE 1.97   SURGEON:  Willey Blade, MD, MPH   ANESTHESIA:  Topical with tetracaine drops augmented with 1% preservative-free intracameral lidocaine.  ESTIMATED BLOOD LOSS: <1 mL   COMPLICATIONS:  None.   DESCRIPTION OF PROCEDURE:  The patient was identified in the holding room and transported to the operating room and placed in the supine position under the operating microscope.  The left eye was identified as the operative eye and it was prepped and draped in the usual sterile ophthalmic fashion.   A 1.0 millimeter clear-corneal paracentesis was made at the 5:00 position. 0.5 ml of preservative-free 1% lidocaine with epinephrine was injected into the anterior chamber.  The anterior chamber was filled with Healon 5 viscoelastic.  A 2.4 millimeter keratome was used to make a near-clear corneal incision at the 2:00 position.  A curvilinear capsulorrhexis was made with a cystotome and capsulorrhexis forceps.  Balanced salt solution was used to hydrodissect and hydrodelineate the nucleus.   Phacoemulsification was then used in stop and chop  fashion to remove the lens nucleus and epinucleus.  The remaining cortex was then removed using the irrigation and aspiration handpiece. Healon was then placed into the capsular bag to distend it for lens placement.  A lens was then injected into the capsular bag.  The remaining viscoelastic was aspirated.   Wounds were hydrated with balanced salt solution.  The anterior chamber was inflated to a physiologic pressure with balanced salt solution.  Intracameral vigamox 0.1 mL undiltued was injected into the eye and a drop placed onto the ocular surface.  Calipers were used to mark 3.5 mm posterior to the limbus in the temporal quadrant.  0.1 mL of Kenalog 40 mg/mL was injected into the vitreous cavity with a 27 gauge needle on a 1 cc syringe.   No wound leaks were noted.  The patient was taken to the recovery room in stable condition without complications of anesthesia or surgery  Willey Blade 05/01/2020, 11:31 AM

## 2020-05-01 NOTE — Anesthesia Preprocedure Evaluation (Signed)
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),     GI/Hepatic GERD  ,  Endo/Other  diabetes, Type 2  Renal/GU      Musculoskeletal   Abdominal   Peds  Hematology   Anesthesia Other Findings   Reproductive/Obstetrics                           Anesthesia Physical Anesthesia Plan  ASA: III  Anesthesia Plan: MAC   Post-op Pain Management:    Induction: Intravenous  PONV Risk Score and Plan: 1  Airway Management Planned: Nasal Cannula and Natural Airway  Additional Equipment: None  Intra-op Plan:   Post-operative Plan:   Informed Consent: I have reviewed the patients History and Physical, chart, labs and discussed the procedure including the risks, benefits and alternatives for the proposed anesthesia with the patient or authorized representative who has indicated his/her understanding and acceptance.       Plan Discussed with: CRNA  Anesthesia Plan Comments: (Quick note taken from retinal detachment surgery during which patient had respiratory arrest:   Pt began moving all extremities all of a sudden towards the end of the case. Pt already on a remifentanil gtt and given a 4mg bolus to try to decrease movement. Pt's RR decreased slowly and began to lose EtCO2 reading. O2 sats maintaining in the 86s. Pt not responding to surgical team or CRNA. Dr. FManus GunningD'Ercole called to the room. Drapes pulled back and pt  cyanotic, hands clinched and not responding to questions. Immediately turned pt and unreliable pulse felt. Airway supported with 100% O2 and 2-man ambu bag with oral airway. Early chest compression started, 0.445mof Narcan, and 50 mcg of Epinephrine given. Immediate strong pulse returned and spontaneous ventilation at a rate of 14-16 breaths per minute, TV >600. Color pink and pt responsive after a minute post pulse return. Face mask placed at 8 lpm, and VSS. Addendum: Pt taken to PACU monitored and O2FM. Full report given to PACU and VSS throughout. Patient responding appropriately to complex questions. Hospitalists updated for intermediate post op monitor bed in step-down.  )        Anesthesia Quick Evaluation

## 2020-05-01 NOTE — H&P (Signed)

## 2020-05-02 ENCOUNTER — Encounter: Payer: Self-pay | Admitting: Ophthalmology

## 2021-01-03 ENCOUNTER — Encounter (INDEPENDENT_AMBULATORY_CARE_PROVIDER_SITE_OTHER): Payer: Medicare HMO | Admitting: Internal Medicine

## 2021-01-03 DIAGNOSIS — G4733 Obstructive sleep apnea (adult) (pediatric): Secondary | ICD-10-CM | POA: Diagnosis not present

## 2021-01-10 ENCOUNTER — Ambulatory Visit (INDEPENDENT_AMBULATORY_CARE_PROVIDER_SITE_OTHER): Payer: Medicare HMO | Admitting: Internal Medicine

## 2021-01-10 DIAGNOSIS — J449 Chronic obstructive pulmonary disease, unspecified: Secondary | ICD-10-CM

## 2021-01-10 DIAGNOSIS — Z6841 Body Mass Index (BMI) 40.0 and over, adult: Secondary | ICD-10-CM

## 2021-01-10 DIAGNOSIS — G4733 Obstructive sleep apnea (adult) (pediatric): Secondary | ICD-10-CM | POA: Diagnosis not present

## 2021-01-10 DIAGNOSIS — I10 Essential (primary) hypertension: Secondary | ICD-10-CM

## 2021-01-10 DIAGNOSIS — G2581 Restless legs syndrome: Secondary | ICD-10-CM

## 2021-01-10 NOTE — Progress Notes (Signed)
Sleep Medicine   Office Visit  Patient Name: Angel Freeman DOB: 06/22/1956 MRN [id number]    Chief Complaint: study results/sleep apnea   HISTORY OF PRESENT ILLNESS: Angel Freeman  Was seen for initial consultation to discuss the results of his recent sleep study(ies) He has severe sleep apnea and CPAP 13 cm H2O was recommended. He reports a long history of loud snoring, witnessed apnea, gasping,  Disrupted and poorly refreshing sleep and daytime sleepiness.The Epworth Sleepiness Score is 14. When he was driving he would have to stop and rest every hour or so.  He goes to bed around 2 a.m. and may be up for the day 2 hours later. His sleep schedule is very irregular and he sleeps quite a bit during the day. He has RLS for which he takes Neurontin.  ROS  General: (-) fever, (-) chills, (-) night sweat Nose and Sinuses: (-) nasal stuffiness or itchiness, (-) postnasal drip, (-) nosebleeds, (-) sinus trouble. Mouth and Throat: (-) sore throat, (-) hoarseness. Neck: (-) swollen glands, (-) enlarged thyroid, (-) neck pain. Respiratory: + cough, + shortness of breath, + wheezing. Neurologic: + numbness, + tingling. Psychiatric: + anxiety, + depression Sleep behavior: -sleep paralysis -hypnogogic hallucinations -dream enactment      -vivid dreams -cataplexy -night terrors -sleep walking   Current Medication: Outpatient Encounter Medications as of 01/10/2021  Medication Sig  . aspirin 81 MG EC tablet Take 1 tablet by mouth daily.  . rosuvastatin (CRESTOR) 40 MG tablet Take 1 tablet by mouth daily.  Marland Kitchen albuterol (VENTOLIN HFA) 108 (90 Base) MCG/ACT inhaler Inhale into the lungs every 6 (six) hours as needed for wheezing or shortness of breath.  Marland Kitchen amLODipine (NORVASC) 10 MG tablet Take 10 mg by mouth daily.  . cetirizine (ZYRTEC) 10 MG tablet Take 10 mg by mouth daily as needed for allergies.  . Dulaglutide (TRULICITY Onyx) Inject into the skin once a week. Thursday  . fluticasone (FLONASE) 50 MCG/ACT  nasal spray Place into both nostrils daily as needed for allergies or rhinitis.  Marland Kitchen gabapentin (NEURONTIN) 300 MG capsule Take 300 mg by mouth 2 (two) times daily. 300 mg at lunchtime.  600 mg at bedtime  . glipiZIDE (GLUCOTROL) 10 MG tablet Take 10 mg by mouth daily.  . Ipratropium-Albuterol (COMBIVENT IN) Inhale into the lungs as needed.  Marland Kitchen losartan (COZAAR) 50 MG tablet Take 50 mg by mouth daily.  . metFORMIN (GLUCOPHAGE) 1000 MG tablet Take 1,000 mg by mouth 2 (two) times daily with a meal.  . omeprazole (PRILOSEC) 20 MG capsule Take 20 mg by mouth daily.  . phenylephrine (NEO-SYNEPHRINE) 1 % nasal spray Place 1 drop into both nostrils every 6 (six) hours as needed for congestion. [employer] - 4 way nose spray  . sertraline (ZOLOFT) 25 MG tablet Take 25 mg by mouth daily.  . sitaGLIPtin (JANUVIA) 50 MG tablet Take 50 mg by mouth daily.  . tamsulosin (FLOMAX) 0.4 MG CAPS capsule Take 0.4 mg by mouth daily.  . [DISCONTINUED] lovastatin (MEVACOR) 40 MG tablet Take 80 mg by mouth daily.   No facility-administered encounter medications on file as of 01/10/2021.    Surgical History: Past Surgical History:  Procedure Laterality Date  . ANEURYSM COILING  1996  . CATARACT EXTRACTION W/PHACO Left 05/01/2020   Procedure: CATARACT EXTRACTION PHACO AND INTRAOCULAR LENS PLACEMENT (IOC) LEFT INTRAVITREAL KENALOG INJECTION DIABETIC 1.97  00:26.0;  Surgeon: Nevada Crane, MD;  Location[facility];  Service: Ophthalmology;  Laterality: Left;  Diabetic - oral meds  . intercranial coiling    . RETINAL DETACHMENT SURGERY  01-15-2019    Medical History: Past Medical History:  Diagnosis Date  . Arthritis    ankles  . Brain aneurysm 1995  . Complication of anesthesia Jan 15, 2019   "Died on table during retina surgery"  . Diabetes mellitus, type 2 (HCC)   . GERD (gastroesophageal reflux disease)   . Hypertension   . Poor circulation    legs  . Seizure (HCC) 1995   with brain aneurysm.  None  since.  . Sleep apnea    no CPAP (yet)  . Wears dentures    full upper    Family History: Non contributory to the present illness  Social History: Social History   Socioeconomic History  . Marital status: Divorced    Spouse name: Not on file  . Number of children: Not on file  . Years of education: Not on file  . Highest education level: Not on file  Occupational History  . Not on file  Tobacco Use  . Smoking status: Never Smoker  . Smokeless tobacco: Never Used  Vaping Use  . Vaping Use: Never used  Substance and Sexual Activity  . Alcohol use: Yes    Comment: 1x/month  . Drug use: Not on file  . Sexual activity: Not on file  Other Topics Concern  . Not on file  Social History Narrative  . Not on file   Social Determinants of Health   Financial Resource Strain: Not on file  Food Insecurity: Not on file  Transportation Needs: Not on file  Physical Activity: Not on file  Stress: Not on file  Social Connections: Not on file  Intimate Partner Violence: Not on file    Vital Signs: Blood pressure 132/78, pulse 71, temperature 97.9 F (36.6 C), resp. rate 18, height 5\' 3"  (1.6 m), weight 244 lb (110.7 kg), SpO2 96 %.  Examination: General Appearance: The patient is well-developed, well-nourished, and in no distress. Neck Circumference: 49.5 cm Skin: Gross inspection of skin unremarkable. Head: normocephalic, no gross deformities. Eyes: no gross deformities noted. ENT: ears appear grossly normal Neurologic: Alert and oriented. No involuntary movements.    EPWORTH SLEEPINESS SCALE:  Scale:  (0)= no chance of dozing; (1)= slight chance of dozing; (2)= moderate chance of dozing; (3)= high chance of dozing  Chance  Situtation    Sitting and reading: 2    Watching TV: 3    Sitting Inactive in public: 1    As a passenger in car: 3      Lying down to rest: 3    Sitting and talking: 1    Sitting quielty after lunch: 3    In a car, stopped in  traffic: 0   TOTAL SCORE:   14 out of 24    SLEEP STUDIES:  1. HST 12/16/20 AHI 40 Spo34min 71%   LABS: No results found for this or any previous visit (from the past 2160 hour(s)).  Radiology: No results found.  No results found.  No results found.    Assessment and Plan: Patient Active Problem List   Diagnosis Date Noted  . Essential hypertension 12/31/2017  . Type 2 diabetes mellitus without complication (HCC) 12/31/2017  . Brain aneurysm 12/31/2017     PLAN OSA:   The study results, diagnosis and treatment recommendations were discussed with the patient Patient ehas a history of loud snoring, witnessed apnea, gasping, disrupted sleep and daytime sleepiness.Patient has comorbid  cardiovascular risk factors including: hypertension which could be exacerbated by pathologic sleep-disordered breathing. A trial of nasal CPAP at 13 cm H2O is recommended. He has a history of RLS on Neurontin however PLMS were still observed in the titration study. As these may clear up on CPAP, we will follow up at the next visit.   1. OSA (obstructive sleep apnea) OSA- CPAP 13 cm H2O will be ordered. Follow up 30+days after set up.  2. RLS (restless legs syndrome) - address at follow up.  3. Essential hypertension Stable. The current medical regimen is effective;  continue present plan and medications.  4. Chronic obstructive pulmonary disease, unspecified COPD type (HCC) Continue inhalers as prescribed.  5. BMI 40.0-44.9, adult (HCC) Obesity Counseling: Had a lengthy discussion regarding patients BMI and weight issues. Patient was instructed on portion control as well as increased activity. Also discussed caloric restrictions with trying to maintain intake less than 2000 Kcal. Discussions were made in accordance with the 5As of weight management. Simple actions such as not eating late and if able to, taking a walk is suggested.   General Counseling: I have discussed the findings of  the evaluation and examination with Angel Freeman.  I have also discussed any further diagnostic evaluation thatmay be needed or ordered today. Angel Freeman verbalizes understanding of the findings of todays visit. We also reviewed his medications today and discussed drug interactions and side effects including but not limited excessive drowsiness and altered mental states. We also discussed that there is always a risk not just to him but also people around him. he has been encouraged to call the office with any questions or concerns that should arise related to todays visit.  No orders of the defined types were placed in this encounter.      I have personally obtained a history, evaluated the patient, evaluated pertinent data, formulated the assessment and plan and placed orders.  This patient was seen by Lynn Ito, PA-C in collaboration with Dr. Freda Munro as a part of collaborative care agreement.   Valentino Hue Sol Blazing, PhD, FAASM  Diplomate, American Board of Sleep Medicine    Yevonne Pax, MD [facility] Diplomate ABMS Pulmonary and Critical Care Medicine Sleep medicine

## 2021-01-14 NOTE — Procedures (Signed)
[facility]  Polysomnogram Report Part I  Phone: [phone] Fax: [phone]  Patient Name: Angel Freeman, Angel Freeman Acquisition Number: [id number]  Date of Birth: [birth year] Acquisition Date: 01/03/2021  Referring Physician: Phineas Inches, AGNP     History: The patient is a 65 year old male with obstructive sleep apnea for CPAP titration. Medical History: sleep apnea, hypertension, high cholesterol, diabetes mellitus, seizures, brain aneurysm.  Medications: amlodipine, trulicity, nexium, flonase, gabapentin, glipizide, losartan, metformin, sertaline, januvia, tamsulosin, aspirin, combivent, lovastatin.  Procedure: This routine overnight polysomnogram was performed on the [facility] 5 using the standard CPAP protocol. This included 6 channels of EEG, 2 channels of EOG, chin EMG, bilateral anterior tibialis EMG, nasal/oral thermistor, PTAF (nasal pressure transducer), chest and abdominal wall movements, EKG, and pulse oximetry.  Description: The total recording time was 505.9 minutes. The total sleep time was 407.6 minutes. There were a total of 88.0 minutes of wakefulness after sleep onset for a?reduced????sleep efficiency of 80.6%. The latency to sleep onset was within normal limits?at 10.3 minutes. The R sleep onset latency was very short at 7.5 minutes.???? Sleep parameters, as a percentage of the total sleep time, demonstrated 4.7% of sleep was in N1 sleep, 80.9% N2, 0.0% N3 and 14.5% R sleep. There were a total of 29 arousals for an arousal index of 4.3 arousals per hour of sleep that was normal.???  Overall, there were a total of 116 respiratory events for a respiratory disturbance index, which includes apneas, hypopneas and RERAs (increased respiratory effort) of 17.1 respiratory events per hour of sleep during the pressure titration.  CPAP was initiated at 5 cm H2O at lights out, 10:29 p.m It was titrated in 1-2 cm increments for obstructive events to 10 cm H2O. The apnea was  well controlled at this pressure in supine, REM sleep. The pressure was further titrated to the final pressure of 13 cm H2O for sleep/wake transitional events. The apnea was well controlled at the final pressure.  Additionally, the baseline oxygen saturation during wakefulness was 95%, during NREM sleep averaged 94%, and during REM sleep averaged 92%. The total duration of oxygen < 90% was 5.9 minutes and <80% was 0.7 minutes.  Cardiac monitoring- There were no significant cardiac rhythm irregularities.   Periodic limb movement monitoring- demonstrated that there were 95 periodic limb movements for a periodic limb movement index of 14.0 periodic limb movements per hour of sleep. Frequent, quasi-periodic limb movements were observed during periods of wakefulness. Impression: This patient's obstructive sleep apnea demonstrated significant improvement with the utilization of nasal CPAP at 13 cm H2O.   There was a slightly elevated periodic limb movement index of 14.0 periodic limb movements per hour of sleep. In addition, frequent, quasi-periodic limb movements were observed during periods of wakefulness. Treatment may be indicated if sleep disruption or sleepiness persist once the patient is fully compliant with CPAP.  Recommendations: 1. Would recommend utilization of nasal CPAP at 13 cm H2O.      2. AF[employer] Paykel Simplus medium mask was used. Chin strap used during study- no. Humidifier used during study- yes.     Yevonne Pax, MD, [facility] Diplomate ABMS-Pulmonary, Critical Care and Sleep Medicine  Electronically reviewed and digitally signed    [facility] CPAP/BIPAP Polysomnogram Report Part II Phone: [phone] Fax: [phone]  Patient last name [name] Neck Size 17.0 in. Acquisition [phone]  Patient first name Angel Freeman Weight 241.0 lbs. Started 01/03/2021 at 10:24:07 PM  Birth date 22-Apr-1956 Height 63.0 in. Stopped 01/04/2021 at 6:58:55 AM  Age 58      Type Adult BMI  42.7 lb/in2 Duration 505.9  Hampton Abbot, RPSGT Sleep Data: Lights Out: 10:29:49 PM Sleep Onset: 10:40:07 PM  Lights On: 6:55:43 AM Sleep Efficiency: 80.6 %  Total Recording Time: 505.9 min Sleep Latency (from Lights Off) 10.3 min  Total Sleep Time (TST): 407.6 min R Latency (from Sleep Onset): 7.5 min  Sleep Period Time: 495.6 min Total number of awakenings: 29  Wake during sleep: 88.0 min Wake After Sleep Onset (WASO): 88.0 min   Sleep Data:         Arousal Summary: Stage  Latency from lights out (min) Latency from sleep onset (min) Duration (min) % Total Sleep Time  Normal values  N 1 10.3 0.0 19.0 4.7 (5%)  N 2 11.3 1.0 329.6 80.9 (50%)  N 3       0.0 0.0 (20%)  R 17.8 7.5 59.0 14.5 (25%)    Number Index  Spontaneous 25 3.7  Apneas & Hypopneas 32 4.7  RERAs 0 0.0       (Apneas & Hypopneas & RERAs)  (32) (4.7)  Limb Movement 20 2.9  Snore 0 0.0  TOTAL 77 11.3      Respiratory Data:  CA OA MA Apnea Hypopnea* A+ H RERA Total  Number 18 0 1 19 97 116 0 116  Mean Dur (sec) 17.5 0.0 25.5 17.9 29.3 27.5 0.0 27.5  Max Dur (sec) 23.0 0.0 25.5 25.5 42.5 42.5 0.0 42.5  Total Dur (min) 5.3 0.0 0.4 5.7 47.4 53.1 0.0 53.1  % of TST 1.3 0.0 0.1 1.4 11.6 13.0 0.0 13.0  Index (#/h TST) 2.6 0.0 0.1 2.8 14.3 17.1 0.0 17.1  *Hypopneas scored based on 4% or greater desaturation.  Sleep Stage:         REM NREM TST  AHI 3.1 19.4 17.1  RDI 3.1 19.4 17.1    Sleep (min) TST (%) REM (min) NREM (min) CA (#) OA (#) MA (#) HYP (#) AHI (#/h) RERA (#) RDI (#/h) Desat (#)  Supine 118.2 29.00 27.0 91.2 5 0 0 31 18.3 0 18.3 52  Non-Supine 289.40 71.00 32.00 257.40 13.00 0.00 1.00 66.00 16.59 0 16.59 109.00  Left: 139.4 34.20 0.5 138.9 2 0 0 41 18.5 0 18.5 53  Prone: 0.1 0.02 0.0 0.1 0 0 0 0 0.0 0 0.00 0  Right: 149.9 36.78 31.5 118.4 11 0 1 25 14.8 0 14.8 56  UP: 0.0 0.00 0.0 0.0 0 0 0 0 0.0 0 0.00 0     Snoring: Total number of snoring episodes  0  Total time with snoring     min (   % of sleep)   Oximetry Distribution:             WK REM NREM TOTAL  Average (%)   95 92 94 94  < 90% 2.5 0.9 2.5 5.9  < 80% 0.5 0.0 0.2 0.7  < 70% 0.0 0.0 0.0 0.0  # of Desaturations* 10 4 148 162  Desat Index (#/hour) 6.3 4.1 25.5 23.8  Desat Max (%) 9 5 29 29   Desat Max Dur (sec) 60.0 54.0 41.0 60.0  Approx Min O2 during sleep 76  Approx min O2 during a respiratory event 69  Was Oxygen added (Y/N) and final rate No:   0 LPM  *Desaturations based on 3% or greater drop from baseline.  Cheyne Stokes Breathing: None Present    Heart Rate Summary:  Average Heart Rate During Sleep 71.3 bpm      Highest Heart Rate During Sleep (95th %) 77.0 bpm      Highest Heart Rate During Sleep 157 bpm (artifact)  Highest Heart Rate During Recording (TIB) 227 bpm (artifact)   Heart Rate Observations: Event Type # Events   Bradycardia 0 Lowest HR Scored: N/A  Sinus Tachycardia During Sleep 0 Highest HR Scored: N/A  Narrow Complex Tachycardia 0 Highest HR Scored: N/A  Wide Complex Tachycardia 0 Highest HR Scored: N/A  Asystole 0 Longest Pause: N/A  Atrial Fibrillation 0 Duration Longest Event: N/A  Other Arrythmias  No Type:   Periodic Limb Movement Data: (Primary legs unless otherwise noted) Total # Limb Movement 221 Limb Movement Index 32.5  Total # PLMS 95 PLMS Index 14.0  Total # PLMS Arousals 10 PLMS Arousal Index 1.5  Percentage Sleep Time with PLMS 54.44min (13.4 % sleep)  Mean Duration limb movements (secs) 234.2   IPAP Level (cmH2O) EPAP Level (cmH2O) Total Duration (min) Sleep Duration (min) Sleep (%) REM (%) CA  #) OA # MA # HYP #) AHI (#/hr) RERAs # RERAs (#/hr) RDI (#/hr)  0 0 2.0 0.0 0.0 0.0 0 0 0 0 0.0 0 0.0 0.0  5 5 12.2 10.7 87.7 4.1 0 0 0 8 44.9 0 0.0 44.9  7 7  29.1 28.6 98.3 0.0 1 0 0 9 21.0 0 0.0 21.0  9 0 2.6 0.0 0.0 0.0 0 0 0 0 0.0 0 0.0 0.0  9 9 78.4 74.5 95.0 7.5 4 0 1 12 13.7 0 0.0 13.7  10 10  280.9 210.0 74.8 18.6 11 0 0 66 22.0 0 0.0 22.0  12 12 51.0  45.0 88.2 0.0 2 0 0 2 5.3 0 0.0 5.3  13 13  37.2 37.2 100.0 0.0 0 0 0 0 0.0 0 0.0 0.0

## 2024-04-01 ENCOUNTER — Inpatient Hospital Stay
Admission: EM | Admit: 2024-04-01 | Discharge: 2024-04-05 | DRG: 683 | Disposition: A | Discharge status: Home / Self Care | Attending: Internal Medicine | Admitting: Internal Medicine

## 2024-04-01 ENCOUNTER — Emergency Department

## 2024-04-01 ENCOUNTER — Other Ambulatory Visit: Payer: Self-pay

## 2024-04-01 DIAGNOSIS — N189 Chronic kidney disease, unspecified: Principal | ICD-10-CM

## 2024-04-01 DIAGNOSIS — E860 Dehydration: Secondary | ICD-10-CM | POA: Diagnosis present

## 2024-04-01 DIAGNOSIS — Z9842 Cataract extraction status, left eye: Secondary | ICD-10-CM | POA: Diagnosis not present

## 2024-04-01 DIAGNOSIS — Z7985 Long-term (current) use of injectable non-insulin antidiabetic drugs: Secondary | ICD-10-CM | POA: Diagnosis not present

## 2024-04-01 DIAGNOSIS — K529 Noninfective gastroenteritis and colitis, unspecified: Secondary | ICD-10-CM | POA: Diagnosis present

## 2024-04-01 DIAGNOSIS — R34 Anuria and oliguria: Secondary | ICD-10-CM | POA: Diagnosis present

## 2024-04-01 DIAGNOSIS — Z1152 Encounter for screening for COVID-19: Secondary | ICD-10-CM

## 2024-04-01 DIAGNOSIS — N1832 Chronic kidney disease, stage 3b: Secondary | ICD-10-CM

## 2024-04-01 DIAGNOSIS — I10 Essential (primary) hypertension: Secondary | ICD-10-CM | POA: Diagnosis present

## 2024-04-01 DIAGNOSIS — E871 Hypo-osmolality and hyponatremia: Secondary | ICD-10-CM | POA: Diagnosis present

## 2024-04-01 DIAGNOSIS — N179 Acute kidney failure, unspecified: Principal | ICD-10-CM | POA: Diagnosis present

## 2024-04-01 DIAGNOSIS — R112 Nausea with vomiting, unspecified: Secondary | ICD-10-CM

## 2024-04-01 DIAGNOSIS — Z794 Long term (current) use of insulin: Secondary | ICD-10-CM | POA: Diagnosis not present

## 2024-04-01 DIAGNOSIS — R338 Other retention of urine: Secondary | ICD-10-CM | POA: Diagnosis present

## 2024-04-01 DIAGNOSIS — Z885 Allergy status to narcotic agent status: Secondary | ICD-10-CM | POA: Diagnosis not present

## 2024-04-01 DIAGNOSIS — Z6841 Body Mass Index (BMI) 40.0 and over, adult: Secondary | ICD-10-CM | POA: Diagnosis not present

## 2024-04-01 DIAGNOSIS — K5000 Crohn's disease of small intestine without complications: Secondary | ICD-10-CM | POA: Insufficient documentation

## 2024-04-01 DIAGNOSIS — K567 Ileus, unspecified: Secondary | ICD-10-CM | POA: Diagnosis present

## 2024-04-01 DIAGNOSIS — N178 Other acute kidney failure: Secondary | ICD-10-CM | POA: Diagnosis not present

## 2024-04-01 DIAGNOSIS — N200 Calculus of kidney: Secondary | ICD-10-CM | POA: Diagnosis present

## 2024-04-01 DIAGNOSIS — E872 Acidosis, unspecified: Secondary | ICD-10-CM | POA: Insufficient documentation

## 2024-04-01 DIAGNOSIS — N401 Enlarged prostate with lower urinary tract symptoms: Secondary | ICD-10-CM | POA: Diagnosis present

## 2024-04-01 DIAGNOSIS — Z8619 Personal history of other infectious and parasitic diseases: Secondary | ICD-10-CM | POA: Diagnosis not present

## 2024-04-01 DIAGNOSIS — E1122 Type 2 diabetes mellitus with diabetic chronic kidney disease: Secondary | ICD-10-CM | POA: Diagnosis present

## 2024-04-01 DIAGNOSIS — Z79899 Other long term (current) drug therapy: Secondary | ICD-10-CM

## 2024-04-01 DIAGNOSIS — E785 Hyperlipidemia, unspecified: Secondary | ICD-10-CM

## 2024-04-01 DIAGNOSIS — R197 Diarrhea, unspecified: Secondary | ICD-10-CM | POA: Insufficient documentation

## 2024-04-01 DIAGNOSIS — E119 Type 2 diabetes mellitus without complications: Secondary | ICD-10-CM

## 2024-04-01 DIAGNOSIS — E66813 Obesity, class 3: Secondary | ICD-10-CM | POA: Diagnosis present

## 2024-04-01 DIAGNOSIS — Z7984 Long term (current) use of oral hypoglycemic drugs: Secondary | ICD-10-CM

## 2024-04-01 DIAGNOSIS — Z961 Presence of intraocular lens: Secondary | ICD-10-CM | POA: Diagnosis present

## 2024-04-01 DIAGNOSIS — K50019 Crohn's disease of small intestine with unspecified complications: Secondary | ICD-10-CM | POA: Diagnosis not present

## 2024-04-01 DIAGNOSIS — I129 Hypertensive chronic kidney disease with stage 1 through stage 4 chronic kidney disease, or unspecified chronic kidney disease: Secondary | ICD-10-CM | POA: Diagnosis present

## 2024-04-01 LAB — URINALYSIS, ROUTINE W REFLEX MICROSCOPIC
Bacteria, UA: NONE SEEN
Bilirubin Urine: NEGATIVE
Glucose, UA: >=500 mg/dL — AB
Hgb urine dipstick: NEGATIVE
Ketones, ur: NEGATIVE mg/dL
Leukocytes,Ua: NEGATIVE
Nitrite: NEGATIVE
Protein, ur: >=300 mg/dL — AB
Specific Gravity, Urine: 1.012 (ref 1.005–1.030)
pH: 6 (ref 5.0–8.0)

## 2024-04-01 LAB — CBC
HCT: 40 % (ref 39.0–52.0)
Hemoglobin: 12.5 g/dL — ABNORMAL LOW (ref 13.0–17.0)
MCH: 29.6 pg (ref 26.0–34.0)
MCHC: 31.3 g/dL (ref 30.0–36.0)
MCV: 94.8 fL (ref 80.0–100.0)
Platelets: 191 10*3/uL (ref 150–400)
RBC: 4.22 MIL/uL (ref 4.22–5.81)
RDW: 13.7 % (ref 11.5–15.5)
WBC: 7.8 10*3/uL (ref 4.0–10.5)
nRBC: 0 % (ref 0.0–0.2)

## 2024-04-01 LAB — HEPATIC FUNCTION PANEL
ALT: 15 U/L (ref 0–44)
AST: 27 U/L (ref 15–41)
Albumin: 2.7 g/dL — ABNORMAL LOW (ref 3.5–5.0)
Alkaline Phosphatase: 74 U/L (ref 38–126)
Bilirubin, Direct: 0.4 mg/dL — ABNORMAL HIGH (ref 0.0–0.2)
Indirect Bilirubin: 1 mg/dL — ABNORMAL HIGH (ref 0.3–0.9)
Total Bilirubin: 1.4 mg/dL — ABNORMAL HIGH (ref 0.0–1.2)
Total Protein: 6.2 g/dL — ABNORMAL LOW (ref 6.5–8.1)

## 2024-04-01 LAB — BASIC METABOLIC PANEL WITH GFR
Anion gap: 10 (ref 5–15)
BUN: 47 mg/dL — ABNORMAL HIGH (ref 8–23)
CO2: 15 mmol/L — ABNORMAL LOW (ref 22–32)
Calcium: 7.8 mg/dL — ABNORMAL LOW (ref 8.9–10.3)
Chloride: 109 mmol/L (ref 98–111)
Creatinine, Ser: 3.59 mg/dL — ABNORMAL HIGH (ref 0.61–1.24)
GFR, Estimated: 18 mL/min — ABNORMAL LOW (ref >60)
Glucose, Bld: 211 mg/dL — ABNORMAL HIGH (ref 70–99)
Potassium: 4.2 mmol/L (ref 3.5–5.1)
Sodium: 134 mmol/L — ABNORMAL LOW (ref 135–145)

## 2024-04-01 LAB — HIV ANTIBODY (ROUTINE TESTING W REFLEX): HIV Screen 4th Generation wRfx: NONREACTIVE

## 2024-04-01 LAB — LIPASE, BLOOD: Lipase: 46 U/L (ref 11–51)

## 2024-04-01 LAB — GLUCOSE, CAPILLARY
Glucose-Capillary: 222 mg/dL — ABNORMAL HIGH (ref 70–99)
Glucose-Capillary: 128 mg/dL — ABNORMAL HIGH (ref 70–99)

## 2024-04-01 LAB — HEMOGLOBIN A1C
Hgb A1c MFr Bld: 6.7 % — ABNORMAL HIGH (ref 4.8–5.6)
Mean Plasma Glucose: 145.59 mg/dL

## 2024-04-01 MED ORDER — SODIUM CHLORIDE 0.9 % IV BOLUS
1000.0000 mL | Freq: Once | INTRAVENOUS | Status: AC
  Administered 2024-04-01: 1000 mL via INTRAVENOUS

## 2024-04-01 MED ORDER — BUSPIRONE HCL 10 MG PO TABS
10.0000 mg | ORAL_TABLET | Freq: Two times a day (BID) | ORAL | Status: DC
  Administered 2024-04-01 – 2024-04-05 (×9): 10 mg via ORAL
  Filled 2024-04-01 (×8): qty 1
  Filled 2024-04-01: qty 2

## 2024-04-01 MED ORDER — DIPHENHYDRAMINE HCL 25 MG PO CAPS: 50.0000 mg | ORAL_CAPSULE | Freq: Every evening | ORAL | Status: DC | PRN

## 2024-04-01 MED ORDER — GABAPENTIN 300 MG PO CAPS
300.0000 mg | ORAL_CAPSULE | Freq: Every day | ORAL | Status: DC
  Administered 2024-04-02 – 2024-04-05 (×4): 300 mg via ORAL
  Filled 2024-04-01 (×4): qty 1

## 2024-04-01 MED ORDER — SERTRALINE HCL 50 MG PO TABS
25.0000 mg | ORAL_TABLET | Freq: Two times a day (BID) | ORAL | Status: DC
  Administered 2024-04-01 – 2024-04-05 (×9): 25 mg via ORAL
  Filled 2024-04-01 (×9): qty 1

## 2024-04-01 MED ORDER — HYDROMORPHONE HCL 1 MG/ML IJ SOLN
0.5000 mg | INTRAMUSCULAR | Status: DC | PRN
  Administered 2024-04-01 – 2024-04-02 (×2): 0.5 mg via INTRAVENOUS
  Filled 2024-04-01 (×2): qty 0.5

## 2024-04-01 MED ORDER — SODIUM CHLORIDE 0.9 % IV SOLN: INTRAVENOUS | Status: DC

## 2024-04-01 MED ORDER — ONDANSETRON HCL 4 MG/2ML IJ SOLN
4.0000 mg | Freq: Once | INTRAMUSCULAR | Status: AC
  Administered 2024-04-01: 4 mg via INTRAVENOUS
  Filled 2024-04-01: qty 2

## 2024-04-01 MED ORDER — ACETAMINOPHEN 325 MG PO TABS
650.0000 mg | ORAL_TABLET | Freq: Four times a day (QID) | ORAL | Status: DC | PRN
Start: 2024-04-01 — End: 2024-04-05

## 2024-04-01 MED ORDER — MELATONIN 5 MG PO TABS: 5.0000 mg | ORAL_TABLET | Freq: Every evening | ORAL | Status: DC | PRN

## 2024-04-01 MED ORDER — DIPHENHYDRAMINE-APAP (SLEEP) 25-500 MG PO TABS: 2.0000 | ORAL_TABLET | Freq: Every evening | ORAL | Status: DC | PRN

## 2024-04-01 MED ORDER — FENTANYL CITRATE PF 50 MCG/ML IJ SOSY
50.0000 ug | PREFILLED_SYRINGE | Freq: Once | INTRAMUSCULAR | Status: AC
  Administered 2024-04-01: 50 ug via INTRAVENOUS
  Filled 2024-04-01: qty 1

## 2024-04-01 MED ORDER — HYDRALAZINE HCL 20 MG/ML IJ SOLN
5.0000 mg | Freq: Four times a day (QID) | INTRAMUSCULAR | Status: DC | PRN
  Administered 2024-04-02 – 2024-04-05 (×5): 5 mg via INTRAVENOUS
  Filled 2024-04-01 (×6): qty 1

## 2024-04-01 MED ORDER — ONDANSETRON HCL 4 MG PO TABS
4.0000 mg | ORAL_TABLET | Freq: Four times a day (QID) | ORAL | Status: DC | PRN
  Administered 2024-04-03: 4 mg via ORAL
  Filled 2024-04-01: qty 1

## 2024-04-01 MED ORDER — GABAPENTIN 300 MG PO CAPS: 300.0000 mg | ORAL_CAPSULE | ORAL | Status: DC

## 2024-04-01 MED ORDER — BUDESON-GLYCOPYRROL-FORMOTEROL 160-9-4.8 MCG/ACT IN AERO
2.0000 | INHALATION_SPRAY | Freq: Two times a day (BID) | RESPIRATORY_TRACT | Status: DC
  Administered 2024-04-01 – 2024-04-05 (×8): 2 via RESPIRATORY_TRACT
  Filled 2024-04-01: qty 5.9

## 2024-04-01 MED ORDER — GABAPENTIN 300 MG PO CAPS
600.0000 mg | ORAL_CAPSULE | Freq: Every day | ORAL | Status: DC
  Administered 2024-04-01 – 2024-04-04 (×4): 600 mg via ORAL
  Filled 2024-04-01 (×4): qty 2

## 2024-04-01 MED ORDER — ONDANSETRON HCL 4 MG/2ML IJ SOLN
4.0000 mg | Freq: Four times a day (QID) | INTRAMUSCULAR | Status: DC | PRN
  Administered 2024-04-01 – 2024-04-04 (×4): 4 mg via INTRAVENOUS
  Filled 2024-04-01 (×4): qty 2

## 2024-04-01 MED ORDER — SODIUM CHLORIDE 0.9 % IV SOLN: 12.5000 mg | Freq: Four times a day (QID) | INTRAVENOUS | Status: AC | PRN

## 2024-04-01 MED ORDER — ACETAMINOPHEN 650 MG RE SUPP: 650.0000 mg | Freq: Four times a day (QID) | RECTAL | Status: DC | PRN

## 2024-04-01 MED ORDER — ALBUTEROL SULFATE (2.5 MG/3ML) 0.083% IN NEBU: 3.0000 mL | INHALATION_SOLUTION | Freq: Four times a day (QID) | RESPIRATORY_TRACT | Status: DC | PRN

## 2024-04-01 MED ORDER — HYDRALAZINE HCL 10 MG PO TABS
10.0000 mg | ORAL_TABLET | Freq: Two times a day (BID) | ORAL | Status: DC
  Administered 2024-04-01 – 2024-04-05 (×9): 10 mg via ORAL
  Filled 2024-04-01 (×9): qty 1

## 2024-04-01 MED ORDER — NITROGLYCERIN 0.4 MG SL SUBL: 0.4000 mg | SUBLINGUAL_TABLET | SUBLINGUAL | Status: DC | PRN

## 2024-04-01 MED ORDER — SENNOSIDES-DOCUSATE SODIUM 8.6-50 MG PO TABS
1.0000 | ORAL_TABLET | Freq: Every evening | ORAL | Status: DC | PRN
  Administered 2024-04-04: 1 via ORAL
  Filled 2024-04-01: qty 1

## 2024-04-01 MED ORDER — ARIPIPRAZOLE 2 MG PO TABS
2.0000 mg | ORAL_TABLET | Freq: Every day | ORAL | Status: DC
  Administered 2024-04-02 – 2024-04-05 (×4): 2 mg via ORAL
  Filled 2024-04-01 (×4): qty 1

## 2024-04-01 MED ORDER — FLUTICASONE PROPIONATE 50 MCG/ACT NA SUSP: 1.0000 | Freq: Every day | NASAL | Status: DC | PRN

## 2024-04-01 MED ORDER — INSULIN ASPART 100 UNIT/ML IJ SOLN
0.0000 [IU] | Freq: Three times a day (TID) | INTRAMUSCULAR | Status: DC
  Administered 2024-04-01: 3 [IU] via SUBCUTANEOUS
  Administered 2024-04-02: 1 [IU] via SUBCUTANEOUS
  Administered 2024-04-02: 3 [IU] via SUBCUTANEOUS
  Administered 2024-04-02: 2 [IU] via SUBCUTANEOUS
  Administered 2024-04-03: 3 [IU] via SUBCUTANEOUS
  Administered 2024-04-03: 1 [IU] via SUBCUTANEOUS
  Administered 2024-04-03: 3 [IU] via SUBCUTANEOUS
  Administered 2024-04-04: 1 [IU] via SUBCUTANEOUS
  Administered 2024-04-04 (×2): 2 [IU] via SUBCUTANEOUS
  Administered 2024-04-05: 1 [IU] via SUBCUTANEOUS
  Administered 2024-04-05: 3 [IU] via SUBCUTANEOUS
  Filled 2024-04-01 (×11): qty 1

## 2024-04-01 MED ORDER — ROSUVASTATIN CALCIUM 10 MG PO TABS
40.0000 mg | ORAL_TABLET | Freq: Every day | ORAL | Status: DC
  Administered 2024-04-02 – 2024-04-04 (×3): 40 mg via ORAL
  Filled 2024-04-01 (×2): qty 4

## 2024-04-01 MED ORDER — ACETAMINOPHEN 500 MG PO TABS: 1000.0000 mg | ORAL_TABLET | Freq: Every evening | ORAL | Status: DC | PRN

## 2024-04-01 MED ORDER — FENTANYL CITRATE PF 50 MCG/ML IJ SOSY
25.0000 ug | PREFILLED_SYRINGE | INTRAMUSCULAR | Status: DC | PRN
  Administered 2024-04-01: 25 ug via INTRAVENOUS
  Filled 2024-04-01: qty 1

## 2024-04-01 MED ORDER — INSULIN ASPART 100 UNIT/ML IJ SOLN
0.0000 [IU] | Freq: Every day | INTRAMUSCULAR | Status: DC
  Administered 2024-04-04: 2 [IU] via SUBCUTANEOUS
  Filled 2024-04-01 (×2): qty 1

## 2024-04-01 MED ORDER — AMLODIPINE BESYLATE 10 MG PO TABS
10.0000 mg | ORAL_TABLET | Freq: Every day | ORAL | Status: DC
  Administered 2024-04-01 – 2024-04-05 (×5): 10 mg via ORAL
  Filled 2024-04-01 (×4): qty 1
  Filled 2024-04-01: qty 2

## 2024-04-01 MED ORDER — HEPARIN SODIUM (PORCINE) 5000 UNIT/ML IJ SOLN
5000.0000 [IU] | Freq: Three times a day (TID) | INTRAMUSCULAR | Status: DC
  Administered 2024-04-01 – 2024-04-05 (×12): 5000 [IU] via SUBCUTANEOUS
  Filled 2024-04-01 (×12): qty 1

## 2024-04-01 NOTE — Progress Notes (Signed)
 Patient ordered on CPAP at night. Patient stated "has one at home but doesn't wear it, and doesn't like our machines and doesn't want one".

## 2024-04-01 NOTE — ED Provider Notes (Signed)
 [facility] Provider Note    Event Date/Time   First MD Initiated Contact with Patient 04/01/24 1102     (approximate)   History   Urinary Retention   HPI  Angel Freeman is a 68 year old male with history of T2DM, CKD presenting to the ER for evaluation of difficulty urinating and abdominal pain.  Patient reports that over the last week and a half he has had decreased urination.  Reports he had only a dribble to pee yesterday and has not had any since then.  Additionally reports generalized abdominal pain with multiple episodes of vomiting.  Denies history of similar.    Physical Exam   Triage Vital Signs: ED Triage Vitals  Encounter Vitals Group     BP 04/01/24 1038 (!) 179/84     Systolic BP Percentile --      Diastolic BP Percentile --      Pulse Rate 04/01/24 1038 87     Resp 04/01/24 1038 19     Temp 04/01/24 1038 98.2 F (36.8 C)     Temp Source 04/01/24 1038 Oral     SpO2 04/01/24 1038 98 %     Weight 04/01/24 1040 233 lb (105.7 kg)     Height 04/01/24 1040 5\' 3"  (1.6 m)     Head Circumference --      Peak Flow --      Pain Score 04/01/24 1044 7     Pain Loc --      Pain Education --      Exclude from Growth Chart --     Most recent vital signs: Vitals:   04/01/24 1144 04/01/24 1200  BP: (!) 189/84 (!) 173/83  Pulse: 78 81  Resp: 16 13  Temp:    SpO2: 98% 97%     General: Awake, interactive  CV:  Regular rate, good peripheral perfusion.  Resp:  Unlabored respirations.  Abd:  Nondistended, soft, generalized tenderness to palpation without rebound or guarding Neuro:  Symmetric facial movement, fluid speech   ED Results / Procedures / Treatments   Labs (all labs ordered are listed, but only abnormal results are displayed) Labs Reviewed  BASIC METABOLIC PANEL WITH GFR - Abnormal; Notable for the following components:      Result Value   Sodium 134 (*)    CO2 15 (*)    Glucose, Bld 211 (*)    BUN 47 (*)    Creatinine,  Ser 3.59 (*)    Calcium 7.8 (*)    GFR, Estimated 18 (*)    All other components within normal limits  CBC - Abnormal; Notable for the following components:   Hemoglobin 12.5 (*)    All other components within normal limits  HEPATIC FUNCTION PANEL - Abnormal; Notable for the following components:   Total Protein 6.2 (*)    Albumin 2.7 (*)    Total Bilirubin 1.4 (*)    Bilirubin, Direct 0.4 (*)    Indirect Bilirubin 1.0 (*)    All other components within normal limits  LIPASE, BLOOD  URINALYSIS, ROUTINE W REFLEX MICROSCOPIC  HIV ANTIBODY (ROUTINE TESTING W REFLEX)     EKG EKG independently reviewed and interpreted by myself demonstrates:    RADIOLOGY Imaging independently reviewed and interpreted by myself demonstrates:  CT abdomen pelvis notes inflammatory changes of the bowel, radiology notes concerns for IBD, with clinical history suspect possible acute infectious process, no hydronephrosis noted  Formal Radiology Read:  CT ABDOMEN PELVIS WO  CONTRAST Result Date: 04/01/2024 CLINICAL DATA:  Abdominal pain. EXAM: CT ABDOMEN AND PELVIS WITHOUT CONTRAST TECHNIQUE: Multidetector CT imaging of the abdomen and pelvis was performed following the standard protocol without IV contrast. RADIATION DOSE REDUCTION: This exam was performed according to the departmental dose-optimization program which includes automated exposure control, adjustment of the mA and/or kV according to patient size and/or use of iterative reconstruction technique. COMPARISON:  None Available. FINDINGS: Evaluation of this exam is limited in the absence of intravenous contrast. Lower chest: The visualized lung bases are clear. There is coronary vascular calcification. No intra-abdominal free air. Small free fluid in the pelvis and adjacent to the liver. Hepatobiliary: The liver is unremarkable. No biliary dilatation. The gallbladder is unremarkable. Pancreas: Unremarkable. No pancreatic ductal dilatation or surrounding  inflammatory changes. Spleen: Normal in size without focal abnormality. Adrenals/Urinary Tract: The adrenal glands are unremarkable. There is a 6 mm nonobstructing right renal interpolar calculus. No hydronephrosis. The left kidney is unremarkable. The visualized ureters and urinary bladder appear unremarkable. Stomach/Bowel: There is inflammatory changes and thickening of the terminal ileum which may be infectious in etiology but concerning for underlying inflammatory bowel disease. Correlation with history of Crohn's is recommended. Several mildly dilated small bowel in the left hemiabdomen, likely reactive ileus. There is no bowel obstruction. The appendix is normal. Vascular/Lymphatic: Mild aortoiliac atherosclerotic disease. The IVC is unremarkable. No portal venous gas. There is no adenopathy. Reproductive: The prostate and seminal vesicles are grossly remarkable. Other: None Musculoskeletal: Degenerative changes of the spine. No acute osseous pathology. IMPRESSION: 1. Inflammatory changes and thickening of the terminal ileum concerning for underlying inflammatory bowel disease. Correlation with history of Crohn's is recommended. 2. No bowel obstruction. Normal appendix. 3. A 6 mm nonobstructing right renal interpolar calculus. No hydronephrosis. 4.  Aortic Atherosclerosis (ICD10-I70.0). Electronically Signed   By: Angus Bark M.D.   On: 04/01/2024 12:15    PROCEDURES:  Critical Care performed: No  Procedures   MEDICATIONS ORDERED IN ED: Medications  acetaminophen  (TYLENOL ) tablet 650 mg (has no administration in time range)    Or  acetaminophen  (TYLENOL ) suppository 650 mg (has no administration in time range)  ondansetron  (ZOFRAN ) tablet 4 mg (has no administration in time range)    Or  ondansetron  (ZOFRAN ) injection 4 mg (has no administration in time range)  heparin  injection 5,000 Units (has no administration in time range)  senna-docusate (Senokot-S) tablet 1 tablet (has no  administration in time range)  0.9 %  sodium chloride  infusion (has no administration in time range)  fentaNYL  (SUBLIMAZE ) injection 25 mcg (has no administration in time range)  HYDROmorphone  (DILAUDID ) injection 0.5 mg (has no administration in time range)  sodium chloride  0.9 % bolus 1,000 mL (1,000 mLs Intravenous New Bag/Given 04/01/24 1148)  ondansetron  (ZOFRAN ) injection 4 mg (4 mg Intravenous Given 04/01/24 1149)  fentaNYL  (SUBLIMAZE ) injection 50 mcg (50 mcg Intravenous Given 04/01/24 1149)     IMPRESSION / MDM / ASSESSMENT AND PLAN / ED COURSE  I reviewed the triage vital signs and the nursing notes.  Differential diagnosis includes, but is not limited to, urinary retention, UTI, small bowel obstruction, kidney stone, other acute intra-abdominal process  Patient's presentation is most consistent with acute presentation with potential threat to life or bodily function.  68 year old male presenting to the emergency department for evaluation of difficulty voiding.  Stable vitals on presentation.  Bladder scan in triage with only 30 cc of urine.  Will obtain labs, CT to further evaluate.  Ordered  for fluids, nausea medication, pain control.  1300 Labs with mild stable anemia, BMP with significant worsened renal function with creatinine of 3.59, BUN of 47.  On most recent BMP in outside records, creatinine of 2.23 in October of last year.  LFTs with mildly elevated bilirubin.  With clinical history, suspect patient may have dehydration related to a viral GI process leading to AKI.  Patient updated on results of workup.  Given his degree of AKI as well as decreased urine production, do think admission is reasonable.  Will reach out to hospitalist team.  1310 Case discussed with Dr. Reinhold Carbine. She will evaluate for anticipated admission.       FINAL CLINICAL IMPRESSION(S) / ED DIAGNOSES   Final diagnoses:  Acute renal failure superimposed on chronic kidney disease, unspecified acute renal  failure type, unspecified CKD stage (HCC)  Decreased urination     Rx / DC Orders   ED Discharge Orders     None        Note:  This document was prepared using Dragon voice recognition software and may include unintentional dictation errors.   Claria Crofts, MD 04/01/24 [phone]

## 2024-04-01 NOTE — Assessment & Plan Note (Signed)
 Home Jardiance 25 mg tablet, dulaglutide subcutaneous, glipizide, metformin, Januvia will not be resumed on admission Insulin SSI with at bedtime coverage ordered

## 2024-04-01 NOTE — Assessment & Plan Note (Addendum)
 With some streaks of blood per patient CT read concerning for inflammatory changes and thickening of the terminal ileum concerning for underlying inflammatory bowel disease Per patient, he had a colonoscopy at [facility] about 1 and half years ago and was told there were some polyps which were removed and otherwise the colonoscopy was normal Discussed with patient at bedside that if he continues to have diarrhea during this hospital course, please let hospitalist tomorrow know Patient may benefit from inpatient GI evaluation versus outpatient referral pending GI symptoms progression

## 2024-04-01 NOTE — ED Triage Notes (Addendum)
 Pt to ED via POV from home. Pt reports has not been able to urinate since yesterday and reports it was only a little. Pt also reports he has been told his kidney function is bad and reports mid abd pain and N/V.   Bladder scan 1st attempt reading 19ml with 2nd attempt reading 30ml

## 2024-04-01 NOTE — Assessment & Plan Note (Signed)
 Symptomatic support suspect secondary to gastroenteritis and given acuity, lower clinical suspicion for IBD However if patient's symptoms remain persistent, patient would benefit from either inpatient GI evaluation versus outpatient referral Ondansetron 4 mg p.o./IV every 6 hours.  For nausea and vomiting, 5 days ordered Phenergan 12.5 mg IV every 6 hours as needed for refractory nausea and vomiting, 1 day ordered

## 2024-04-01 NOTE — Assessment & Plan Note (Signed)
 -  This complicates overall care and prognosis.

## 2024-04-01 NOTE — ED Notes (Signed)
 ED TO INPATIENT HANDOFF REPORT  ED Nurse Name and Phone #: Nira Basset [phone]  S Name/Age/Gender Angel Freeman 68 y.o. male Room/Bed: ED45A/ED45A  Code Status   Code Status: Full Code  Home/SNF/Other Home Patient oriented to: self, place, time, and situation Is this baseline? Yes   Triage Complete: Triage complete  Chief Complaint AKI (acute kidney injury) (HCC) [N17.9]  Triage Note Pt to ED via POV from home. Pt reports has not been able to urinate since yesterday and reports it was only a little. Pt also reports he has been told his kidney function is bad and reports mid abd pain and N/V.   Bladder scan 1st attempt reading 19ml with 2nd attempt reading 30ml   Allergies Allergies  Allergen Reactions   Codeine Rash    Level of Care/Admitting Diagnosis ED Disposition     ED Disposition  Admit   Condition  --   Comment  Hospital Area: [facility] [[id number]]  Level of Care: Telemetry Medical [104]  Covid Evaluation: Asymptomatic - no recent exposure (last 10 days) testing not required  Diagnosis: AKI (acute kidney injury) [facility]) [[id number]]  Admitting Physician: Cedric Cohn [[id number]]  Attending Physician: [facility], AMY N [id number]  Certification:: I certify this patient will need inpatient services for at least 2 midnights  Expected Medical Readiness: 04/04/2024          B Medical/Surgery History Past Medical History:  Diagnosis Date   Arthritis    ankles   Brain aneurysm 1995   Complication of anesthesia 01-24-19   "Died on table during retina surgery"   Diabetes mellitus, type 2 (HCC)    GERD (gastroesophageal reflux disease)    Hypertension    Poor circulation    legs   Seizure (HCC) 1995   with brain aneurysm.  None since.   Sleep apnea    no CPAP (yet)   Wears dentures    full upper   Past Surgical History:  Procedure Laterality Date   ANEURYSM COILING  1996   CATARACT EXTRACTION W/PHACO Left 05/01/2020   Procedure: CATARACT EXTRACTION  PHACO AND INTRAOCULAR LENS PLACEMENT (IOC) LEFT INTRAVITREAL KENALOG  INJECTION DIABETIC 1.97  00:26.0;  Surgeon: Rosa College, MD;  Location: [facility];  Service: Ophthalmology;  Laterality: Left;  Diabetic - oral meds   intercranial coiling     RETINAL DETACHMENT SURGERY  01-24-2019     A IV Location/Drains/Wounds Patient Lines/Drains/Airways Status     Active Line/Drains/Airways     Name Placement date Placement time Site Days   Peripheral IV 04/01/24 20 G 1" Right Antecubital 04/01/24  1148  Antecubital  less than 1   Incision (Closed) 05/01/20 Eye 05/01/20  1121  -- 1431            Intake/Output Last 24 hours No intake or output data in the 24 hours ending 04/01/24 1352  Labs/Imaging Results for orders placed or performed during the hospital encounter of 04/01/24 (from the past 48 hours)  Basic metabolic panel     Status: Abnormal   Collection Time: 04/01/24 10:51 AM  Result Value Ref Range   Sodium 134 (L) 135 - 145 mmol/L   Potassium 4.2 3.5 - 5.1 mmol/L    Comment: HEMOLYSIS AT THIS LEVEL MAY AFFECT RESULT   Chloride 109 98 - 111 mmol/L   CO2 15 (L) 22 - 32 mmol/L   Glucose, Bld 211 (H) 70 - 99 mg/dL    Comment: Glucose reference range applies only  to samples taken after fasting for at least 8 hours.   BUN 47 (H) 8 - 23 mg/dL   Creatinine, Ser 4.40 (H) 0.61 - 1.24 mg/dL   Calcium 7.8 (L) 8.9 - 10.3 mg/dL   GFR, Estimated 18 (L) >60 mL/min    Comment: (NOTE) Calculated using the CKD-EPI Creatinine Equation (2021)    Anion gap 10 5 - 15    Comment: Performed at [facility], [address] [place], [place] [address]  CBC     Status: Abnormal   Collection Time: 04/01/24 10:51 AM  Result Value Ref Range   WBC 7.8 4.0 - 10.5 K/uL   RBC 4.22 4.22 - 5.81 MIL/uL   Hemoglobin 12.5 (L) 13.0 - 17.0 g/dL   HCT 59.5 63.8 - 75.6 %   MCV 94.8 80.0 - 100.0 fL   MCH 29.6 26.0 - 34.0 pg   MCHC 31.3 30.0 - 36.0 g/dL   RDW 43.3 29.5 - 18.8 %    Platelets 191 150 - 400 K/uL   nRBC 0.0 0.0 - 0.2 %    Comment: Performed at [facility], [address] [place], [place] [address]  Lipase, blood     Status: None   Collection Time: 04/01/24 10:51 AM  Result Value Ref Range   Lipase 46 11 - 51 U/L    Comment: Performed at [facility], [address] [place], [place] [address]  Hepatic function panel     Status: Abnormal   Collection Time: 04/01/24 10:51 AM  Result Value Ref Range   Total Protein 6.2 (L) 6.5 - 8.1 g/dL   Albumin 2.7 (L) 3.5 - 5.0 g/dL   AST 27 15 - 41 U/L    Comment: HEMOLYSIS AT THIS LEVEL MAY AFFECT RESULT   ALT 15 0 - 44 U/L    Comment: HEMOLYSIS AT THIS LEVEL MAY AFFECT RESULT   Alkaline Phosphatase 74 38 - 126 U/L   Total Bilirubin 1.4 (H) 0.0 - 1.2 mg/dL    Comment: HEMOLYSIS AT THIS LEVEL MAY AFFECT RESULT   Bilirubin, Direct 0.4 (H) 0.0 - 0.2 mg/dL   Indirect Bilirubin 1.0 (H) 0.3 - 0.9 mg/dL    Comment: Performed at [facility], [address], [place], [place] [address]   CT ABDOMEN PELVIS WO CONTRAST Result Date: 04/01/2024 CLINICAL DATA:  Abdominal pain. EXAM: CT ABDOMEN AND PELVIS WITHOUT CONTRAST TECHNIQUE: Multidetector CT imaging of the abdomen and pelvis was performed following the standard protocol without IV contrast. RADIATION DOSE REDUCTION: This exam was performed according to the departmental dose-optimization program which includes automated exposure control, adjustment of the mA and/or kV according to patient size and/or use of iterative reconstruction technique. COMPARISON:  None Available. FINDINGS: Evaluation of this exam is limited in the absence of intravenous contrast. Lower chest: The visualized lung bases are clear. There is coronary vascular calcification. No intra-abdominal free air. Small free fluid in the pelvis and adjacent to the liver. Hepatobiliary: The liver is unremarkable. No biliary dilatation. The gallbladder is unremarkable. Pancreas:  Unremarkable. No pancreatic ductal dilatation or surrounding inflammatory changes. Spleen: Normal in size without focal abnormality. Adrenals/Urinary Tract: The adrenal glands are unremarkable. There is a 6 mm nonobstructing right renal interpolar calculus. No hydronephrosis. The left kidney is unremarkable. The visualized ureters and urinary bladder appear unremarkable. Stomach/Bowel: There is inflammatory changes and thickening of the terminal ileum which may be infectious in etiology but concerning for underlying inflammatory bowel disease. Correlation with history of  Crohn's is recommended. Several mildly dilated small bowel in the left hemiabdomen, likely reactive ileus. There is no bowel obstruction. The appendix is normal. Vascular/Lymphatic: Mild aortoiliac atherosclerotic disease. The IVC is unremarkable. No portal venous gas. There is no adenopathy. Reproductive: The prostate and seminal vesicles are grossly remarkable. Other: None Musculoskeletal: Degenerative changes of the spine. No acute osseous pathology. IMPRESSION: 1. Inflammatory changes and thickening of the terminal ileum concerning for underlying inflammatory bowel disease. Correlation with history of Crohn's is recommended. 2. No bowel obstruction. Normal appendix. 3. A 6 mm nonobstructing right renal interpolar calculus. No hydronephrosis. 4.  Aortic Atherosclerosis (ICD10-I70.0). Electronically Signed   By: Angus Bark M.D.   On: 04/01/2024 12:15    Pending Labs Unresulted Labs (From admission, onward)     Start     Ordered   04/02/24 0500  Basic metabolic panel  Tomorrow morning,   STAT        04/01/24 1330   04/02/24 0500  CBC  Tomorrow morning,   STAT        04/01/24 1330   04/01/24 1349  Hemoglobin A1c  Once,   R       Comments: To assess prior glycemic control    04/01/24 1348   04/01/24 1328  HIV Antibody (routine testing w rflx)  (HIV Antibody (Routine testing w reflex) panel)  Once,   R        04/01/24 1330    04/01/24 1045  Urinalysis, Routine w reflex microscopic -Urine, Clean Catch  Once,   URGENT       Comments: May I&O cath if menses   Question:  Specimen Source  Answer:  Urine, Clean Catch   04/01/24 1045            Vitals/Pain Today's Vitals   04/01/24 1044 04/01/24 1144 04/01/24 1149 04/01/24 1200  BP:  (!) 189/84  (!) 173/83  Pulse:  78  81  Resp:  16  13  Temp:      TempSrc:      SpO2:  98%  97%  Weight:      Height:      PainSc: 7   7      Isolation Precautions No active isolations  Medications Medications  acetaminophen (TYLENOL) tablet 650 mg (has no administration in time range)    Or  acetaminophen (TYLENOL) suppository 650 mg (has no administration in time range)  ondansetron (ZOFRAN) tablet 4 mg (has no administration in time range)    Or  ondansetron (ZOFRAN) injection 4 mg (has no administration in time range)  heparin injection 5,000 Units (has no administration in time range)  senna-docusate (Senokot-S) tablet 1 tablet (has no administration in time range)  0.9 %  sodium chloride  infusion (has no administration in time range)  fentaNYL (SUBLIMAZE) injection 25 mcg (has no administration in time range)  HYDROmorphone (DILAUDID) injection 0.5 mg (has no administration in time range)  hydrALAZINE (APRESOLINE) injection 5 mg (has no administration in time range)  melatonin tablet 5 mg (has no administration in time range)  promethazine (PHENERGAN) 12.5 mg in sodium chloride  0.9 % 50 mL IVPB (has no administration in time range)  insulin aspart (novoLOG) injection 0-9 Units (has no administration in time range)  insulin aspart (novoLOG) injection 0-5 Units (has no administration in time range)  sodium chloride  0.9 % bolus 1,000 mL (0 mLs Intravenous Stopped 04/01/24 1346)  ondansetron (ZOFRAN) injection 4 mg (4 mg Intravenous Given 04/01/24 1149)  fentaNYL (SUBLIMAZE)  injection 50 mcg (50 mcg Intravenous Given 04/01/24 1149)    Mobility walks      R Recommendations: See Admitting Provider Note  Report given to:   Additional Notes:

## 2024-04-01 NOTE — Assessment & Plan Note (Addendum)
 Home amlodipine 10 mg daily, hydralazine 10 mg twice daily were resumed on admission Home lisinopril 5 mg, losartan 50 mg were held on admission in setting of acute kidney injury AM team to resume when the benefits outweigh the risk Hydralazine 5 mg IV every 6 hours as needed for SBP greater 170, 5 days ordered

## 2024-04-01 NOTE — ED Notes (Signed)
 Patient transported to CT

## 2024-04-01 NOTE — H&P (Addendum)
 History and Physical   Angel Freeman ZOX:[id number] DOB: 1956/11/04 DOA: 04/01/2024  PCP: Claudine Cullens, MD  Outpatient Specialists: [facility] ID, Dr. Sigmund Drew Patient coming from: Home via POV  I have personally briefly reviewed patient's old medical records in [facility] EMR.  Chief Concern: Abdominal pain, nausea, vomiting  HPI: Mr. Angel Freeman is a 68 year old male with history of hypertension, non-insulin-dependent diabetes mellitus, depression, hyperlipidemia, severe sleep apnea on CPAP with Epworth Sleepiness score 14, history of MSSA bacteremia, history of diabetic left mid foot ulcer, who presents emergency department for chief concerns of no urination, abdominal pain, nausea, vomiting that started since yesterday.  Vitals in the ED showed T of 98.2, rr 19, hr 87, blood pressure 179/84, SpO2 98% on room air.  Serum sodium is 134, potassium 4.2, chloride 109, bicarb 15, BUN of 47, serum creatinine of 3.59, EGFR of 18, nonfasting blood glucose 211, WBC 7.8, hemoglobin 12.5, platelets of 191.  ED treatment: Fentanyl 50 mcg IV one-time dose, ondansetron 4 mg IV one-time dose. ------------------------------------------- At bedside, patient was able to tell me his first and last name, age, location, current calendar year.  Reports has been having abdominal discomfort and poor p.o. intake for the last 2 weeks and it has been worsening over the last 2 to 3 days.  He reports diarrhea and states he may have seen some streaks of blood in it but is uncertain.  He denies vomiting over the last day.  He denies fever, chills, dysuria, hematuria, swelling of his lower extremity, syncope.  He endorses that he has not urinated since yesterday.  He endorses poor p.o. intake.  Social history: He lives at home with his fiance.  He denies tobacco, EtOH, recreational drug use.  ROS: Constitutional: no weight change, no fever ENT/Mouth: no sore throat, no rhinorrhea Eyes: no eye pain, no vision  changes Cardiovascular: no chest pain, no dyspnea,  no edema, no palpitations Respiratory: no cough, no sputum, no wheezing Gastrointestinal: + nausea, no vomiting, + diarrhea, no constipation Genitourinary: no urinary incontinence, no dysuria, no hematuria Musculoskeletal: no arthralgias, no myalgias Skin: no skin lesions, no pruritus, Neuro: + weakness, no loss of consciousness, no syncope Psych: no anxiety, no depression, + decrease appetite Heme/Lymph: no bruising, no bleeding  ED Course: Discussed with EDP, patient requiring hospitalization for chief concerns of acute kidney injury.  Assessment/Plan  Principal Problem:   AKI (acute kidney injury) (HCC) Active Problems:   Essential hypertension   Nausea and vomiting   Type 2 diabetes mellitus without complication (HCC)   CKD stage 3b, GFR 30-44 ml/min (HCC)   Hyperlipidemia   Obesity, Class III, BMI 40-49.9 (morbid obesity)   Diarrhea   Assessment and Plan:  * AKI (acute kidney injury) (HCC) Suspect prerenal secondary to GI loss Symptomatic support per above Status post sodium chloride  1 L bolus per EDP Continue sodium chloride  infusion at 125 mL/h, 1 day ordered Strict I's and O's Recheck BMP in a.m. Renal does not improve noticeably, AM team to consult nephrology for further recommendation and guidance as needed  Nausea and vomiting Symptomatic support suspect secondary to gastroenteritis and given acuity, lower clinical suspicion for IBD However if patient's symptoms remain persistent, patient would benefit from either inpatient GI evaluation versus outpatient referral Ondansetron 4 mg p.o./IV every 6 hours.  For nausea and vomiting, 5 days ordered Phenergan 12.5 mg IV every 6 hours as needed for refractory nausea and vomiting, 1 day ordered  Essential hypertension Home  amlodipine 10 mg daily, hydralazine 10 mg twice daily were resumed on admission Home lisinopril 5 mg, losartan 50 mg were held on admission in  setting of acute kidney injury AM team to resume when the benefits outweigh the risk Hydralazine 5 mg IV every 6 hours as needed for SBP greater 170, 5 days ordered  Type 2 diabetes mellitus without complication (HCC) Home Jardiance 25 mg tablet, dulaglutide subcutaneous, glipizide, metformin, Januvia will not be resumed on admission Insulin SSI with at bedtime coverage ordered  Hyperlipidemia Rosuvastatin 40 mg nightly resumed  Diarrhea With some streaks of blood per patient CT read concerning for inflammatory changes and thickening of the terminal ileum concerning for underlying inflammatory bowel disease Per patient, he had a colonoscopy at [facility] about 1 and half years ago and was told there were some polyps which were removed and otherwise the colonoscopy was normal Discussed with patient at bedside that if he continues to have diarrhea during this hospital course, please let hospitalist tomorrow know Patient may benefit from inpatient GI evaluation versus outpatient referral pending GI symptoms progression  Obesity, Class III, BMI 40-49.9 (morbid obesity) This complicates overall care and prognosis.   Chart reviewed.   DVT prophylaxis: Heparin 5000 units subcutaneous every 8 hours Code Status: Full code Diet: Renal/carb modified Family Communication: A phone was offered, patient declined stating that his fiance already knows he is being admitted to the hospital. Disposition Plan: Pending clinical course Consults called: None at this time Admission status: Telemetry medical, inpatient  Past Medical History:  Diagnosis Date   Arthritis    ankles   Brain aneurysm 1995   Complication of anesthesia 01/12/2019   "Died on table during retina surgery"   Diabetes mellitus, type 2 (HCC)    GERD (gastroesophageal reflux disease)    Hypertension    Poor circulation    legs   Seizure (HCC) 1995   with brain aneurysm.  None since.   Sleep apnea    no CPAP (yet)   Wears dentures     full upper   Past Surgical History:  Procedure Laterality Date   ANEURYSM COILING  1996   CATARACT EXTRACTION W/PHACO Left 05/01/2020   Procedure: CATARACT EXTRACTION PHACO AND INTRAOCULAR LENS PLACEMENT (IOC) LEFT INTRAVITREAL KENALOG  INJECTION DIABETIC 1.97  00:26.0;  Surgeon: Rosa College, MD;  Location: [facility];  Service: Ophthalmology;  Laterality: Left;  Diabetic - oral meds   intercranial coiling     RETINAL DETACHMENT SURGERY  January 12, 2019   Social History:  reports that he has never smoked. He has never used smokeless tobacco. He reports current alcohol use. He reports that he does not currently use drugs.  Allergies  Allergen Reactions   Codeine Rash   Family History  Problem Relation Age of Onset   Cancer Father    Macular degeneration Father    Family history: Family history reviewed and not pertinent.  Prior to Admission medications   Medication Sig Start Date End Date Taking? Authorizing Provider  albuterol (VENTOLIN HFA) 108 (90 Base) MCG/ACT inhaler Inhale into the lungs every 6 (six) hours as needed for wheezing or shortness of breath.   Yes [provider]  ARIPiprazole (ABILIFY) 2 MG tablet Take 1 tablet by mouth daily. 07/27/21  Yes [provider]  busPIRone (BUSPAR) 10 MG tablet Take 10 mg by mouth 2 (two) times daily. 07/27/21  Yes [provider]  furosemide (LASIX) 20 MG tablet Take 20 mg by mouth 3 (three) times  daily. 12/29/23  Yes [provider]  hydrALAZINE  (APRESOLINE ) 10 MG tablet Take 10 mg by mouth 2 (two) times daily. 12/04/23  Yes [provider]  lisinopril (ZESTRIL) 5 MG tablet Take 1 tablet by mouth daily. 04/22/17  Yes [provider]  lovastatin (MEVACOR) 40 MG tablet Take 1 tablet by mouth at bedtime. 04/22/17  Yes [provider]  nitroGLYCERIN  (NITROSTAT ) 0.4 MG SL tablet Place 0.4 mg under the tongue every 5 (five) minutes as needed.   Yes [provider]   amLODipine  (NORVASC ) 10 MG tablet Take 10 mg by mouth daily.    [provider]  budesonide -glycopyrrolate -formoterol  (BREZTRI  AEROSPHERE) 160-9-4.8 MCG/ACT AERO inhaler Inhale 2 puffs into the lungs in the morning and at bedtime.    [provider]  cetirizine (ZYRTEC) 10 MG tablet Take 10 mg by mouth daily as needed for allergies.    [provider]  diphenhydramine -acetaminophen  (TYLENOL  PM) 25-500 MG TABS tablet Take 2 tablets by mouth at bedtime as needed.    [provider]  Dulaglutide (TRULICITY [place]) Inject into the skin once a week. Thursday    [provider]  empagliflozin (JARDIANCE) 25 MG TABS tablet Take 1 tablet by mouth daily.    [provider]  fluticasone  (FLONASE ) 50 MCG/ACT nasal spray Place into both nostrils daily as needed for allergies or rhinitis.    [provider]  gabapentin  (NEURONTIN ) 300 MG capsule Take 300 mg by mouth 2 (two) times daily. 300 mg at lunchtime.  600 mg at bedtime    [provider]  glipiZIDE (GLUCOTROL) 10 MG tablet Take 10 mg by mouth daily.    [provider]  Ipratropium-Albuterol  (COMBIVENT IN) Inhale into the lungs as needed.    [provider]  losartan  (COZAAR ) 50 MG tablet Take 50 mg by mouth daily.    [provider]  metFORMIN (GLUCOPHAGE) 1000 MG tablet Take 1,000 mg by mouth 2 (two) times daily with a meal.    [provider]  metoprolol succinate (TOPROL-XL) 100 MG 24 hr tablet Take 100 mg by mouth daily.    [provider]  omeprazole (PRILOSEC) 20 MG capsule Take 20 mg by mouth daily.    [provider]  phenylephrine (NEO-SYNEPHRINE) 1 % nasal spray Place 1 drop into both nostrils every 6 (six) hours as needed for congestion. [employer] - 4 way nose spray    [provider]  prochlorperazine (COMPAZINE) 10 MG tablet Take 10 mg by mouth every 8 (eight) hours as needed for nausea.    [provider]   rosuvastatin  (CRESTOR ) 40 MG tablet Take 1 tablet by mouth daily. 12/19/20 12/19/21  [provider]  sertraline  (ZOLOFT ) 25 MG tablet Take 25 mg by mouth daily.    [provider]  sitaGLIPtin (JANUVIA) 50 MG tablet Take 50 mg by mouth daily.    [provider]  tamsulosin (FLOMAX) 0.4 MG CAPS capsule Take 0.4 mg by mouth daily.    [provider]   Physical Exam: Vitals:   04/01/24 1200 04/01/24 1406 04/01/24 1429 04/01/24 1542  BP: (!) 173/83 (!) 199/95  (!) 180/82  Pulse: 81 87 87 81  Resp: 13 16 16 19   Temp:   98.4 F (36.9 C) 98.7 F (37.1 C)  TempSrc:   Oral Oral  SpO2: 97% 100% 98% 98%  Weight:      Height:       Constitutional: appears age-appropriate, NAD, calm Eyes: PERRL, lids  and conjunctivae normal ENMT: Mucous membranes are moist. Posterior pharynx clear of any exudate or lesions. Age-appropriate dentition. Hearing appropriate Neck: normal, supple, no masses, no thyromegaly Respiratory: clear to auscultation bilaterally, no wheezing, no crackles. Normal respiratory effort. No accessory muscle use.  Cardiovascular: Regular rate and rhythm, no murmurs / rubs / gallops. No extremity edema. 2+ pedal pulses. No carotid bruits.  Abdomen: obese abdomen, no tenderness, no masses palpated, no hepatosplenomegaly. Bowel sounds positive.  Musculoskeletal: no clubbing / cyanosis. No joint deformity upper and lower extremities. Good ROM, no contractures, no atrophy. Normal muscle tone.  Skin: no rashes, lesions, ulcers. No induration Neurologic: Sensation intact. Strength 5/5 in all 4.  Psychiatric: Normal judgment and insight. Alert and oriented x 3. Normal mood.   EKG: Not indicated this time  Chest x-ray on Admission: Not indicated at this time  CT ABDOMEN PELVIS WO CONTRAST Result Date: 04/01/2024 CLINICAL DATA:  Abdominal pain. EXAM: CT ABDOMEN AND PELVIS WITHOUT CONTRAST TECHNIQUE: Multidetector CT imaging of the abdomen and pelvis was  performed following the standard protocol without IV contrast. RADIATION DOSE REDUCTION: This exam was performed according to the departmental dose-optimization program which includes automated exposure control, adjustment of the mA and/or kV according to patient size and/or use of iterative reconstruction technique. COMPARISON:  None Available. FINDINGS: Evaluation of this exam is limited in the absence of intravenous contrast. Lower chest: The visualized lung bases are clear. There is coronary vascular calcification. No intra-abdominal free air. Small free fluid in the pelvis and adjacent to the liver. Hepatobiliary: The liver is unremarkable. No biliary dilatation. The gallbladder is unremarkable. Pancreas: Unremarkable. No pancreatic ductal dilatation or surrounding inflammatory changes. Spleen: Normal in size without focal abnormality. Adrenals/Urinary Tract: The adrenal glands are unremarkable. There is a 6 mm nonobstructing right renal interpolar calculus. No hydronephrosis. The left kidney is unremarkable. The visualized ureters and urinary bladder appear unremarkable. Stomach/Bowel: There is inflammatory changes and thickening of the terminal ileum which may be infectious in etiology but concerning for underlying inflammatory bowel disease. Correlation with history of Crohn's is recommended. Several mildly dilated small bowel in the left hemiabdomen, likely reactive ileus. There is no bowel obstruction. The appendix is normal. Vascular/Lymphatic: Mild aortoiliac atherosclerotic disease. The IVC is unremarkable. No portal venous gas. There is no adenopathy. Reproductive: The prostate and seminal vesicles are grossly remarkable. Other: None Musculoskeletal: Degenerative changes of the spine. No acute osseous pathology. IMPRESSION: 1. Inflammatory changes and thickening of the terminal ileum concerning for underlying inflammatory bowel disease. Correlation with history of Crohn's is recommended. 2. No bowel  obstruction. Normal appendix. 3. A 6 mm nonobstructing right renal interpolar calculus. No hydronephrosis. 4.  Aortic Atherosclerosis (ICD10-I70.0). Electronically Signed   By: Angus Bark M.D.   On: 04/01/2024 12:15   Labs on Admission: I have personally reviewed following labs  CBC: Recent Labs  Lab 04/01/24 1051  WBC 7.8  HGB 12.5*  HCT 40.0  MCV 94.8  PLT 191   Basic Metabolic Panel: Recent Labs  Lab 04/01/24 1051  NA 134*  K 4.2  CL 109  CO2 15*  GLUCOSE 211*  BUN 47*  CREATININE 3.59*  CALCIUM 7.8*   GFR: Estimated Creatinine Clearance: 21.9 mL/min (A) (by C-G formula based on SCr of 3.59 mg/dL (H)).  Liver Function Tests: Recent Labs  Lab 04/01/24 1051  AST 27  ALT 15  ALKPHOS 74  BILITOT 1.4*  PROT 6.2*  ALBUMIN 2.7*   Recent Labs  Lab 04/01/24 1051  LIPASE 46   Urine analysis:    Component Value Date/Time   COLORURINE YELLOW (A) 04/01/2024 1428   APPEARANCEUR CLEAR (A) 04/01/2024 1428   LABSPEC 1.012 04/01/2024 1428   PHURINE 6.0 04/01/2024 1428   GLUCOSEU >=500 (A) 04/01/2024 1428   HGBUR NEGATIVE 04/01/2024 1428   BILIRUBINUR NEGATIVE 04/01/2024 1428   KETONESUR NEGATIVE 04/01/2024 1428   PROTEINUR >=300 (A) 04/01/2024 1428   NITRITE NEGATIVE 04/01/2024 1428   LEUKOCYTESUR NEGATIVE 04/01/2024 1428   This document was prepared using Dragon Voice Recognition software and may include unintentional dictation errors.  Dr. Reinhold Carbine [facility]  If 7PM-7AM, please contact overnight-coverage provider If 7AM-7PM, please contact day attending provider [email or web address]  04/01/2024, 5:13 PM

## 2024-04-01 NOTE — ED Notes (Signed)
 Per triage RN pt bladder scan 30 mL

## 2024-04-01 NOTE — Hospital Course (Addendum)
 Mr. Jermain Curt is a 68 year old male with history of hypertension, non-insulin -dependent diabetes mellitus, depression, hyperlipidemia, severe sleep apnea on CPAP with Epworth Sleepiness score 14, history of MSSA bacteremia, history of diabetic left mid foot ulcer, who presents emergency department for chief concerns of no urination, abdominal pain, nausea, vomiting that started since yesterday.  Vitals in the ED showed T of 98.2, rr 19, hr 87, blood pressure 179/84, SpO2 98% on room air.  Serum sodium is 134, potassium 4.2, chloride 109, bicarb 15, BUN of 47, serum creatinine of 3.59, EGFR of 18, nonfasting blood glucose 211, WBC 7.8, hemoglobin 12.5, platelets of 191.  ED treatment: Fentanyl  50 mcg IV one-time dose, ondansetron  4 mg IV one-time dose.

## 2024-04-01 NOTE — Assessment & Plan Note (Addendum)
 Suspect prerenal secondary to GI loss Symptomatic support per above Status post sodium chloride  1 L bolus per EDP Continue sodium chloride  infusion at 125 mL/h, 1 day ordered Strict I's and O's Recheck BMP in a.m. Renal does not improve noticeably, AM team to consult nephrology for further recommendation and guidance as needed

## 2024-04-01 NOTE — Plan of Care (Signed)
  Problem: Metabolic: Goal: Ability to maintain appropriate glucose levels will improve Outcome: Progressing   Problem: Nutritional: Goal: Maintenance of adequate nutrition will improve Outcome: Progressing   Problem: Skin Integrity: Goal: Risk for impaired skin integrity will decrease Outcome: Progressing   Problem: Education: Goal: Knowledge of General Education information will improve Description: Including pain rating scale, medication(s)/side effects and non-pharmacologic comfort measures Outcome: Progressing   Problem: Activity: Goal: Risk for activity intolerance will decrease Outcome: Progressing   Problem: Nutrition: Goal: Adequate nutrition will be maintained Outcome: Progressing   Problem: Coping: Goal: Level of anxiety will decrease Outcome: Progressing   Problem: Pain Managment: Goal: General experience of comfort will improve and/or be controlled Outcome: Progressing   Problem: Safety: Goal: Ability to remain free from injury will improve Outcome: Progressing

## 2024-04-01 NOTE — Assessment & Plan Note (Signed)
Continue statin 

## 2024-04-02 DIAGNOSIS — K50019 Crohn's disease of small intestine with unspecified complications: Secondary | ICD-10-CM

## 2024-04-02 DIAGNOSIS — N1832 Chronic kidney disease, stage 3b: Secondary | ICD-10-CM | POA: Diagnosis not present

## 2024-04-02 DIAGNOSIS — E872 Acidosis, unspecified: Secondary | ICD-10-CM | POA: Insufficient documentation

## 2024-04-02 DIAGNOSIS — N178 Other acute kidney failure: Secondary | ICD-10-CM

## 2024-04-02 DIAGNOSIS — I10 Essential (primary) hypertension: Secondary | ICD-10-CM

## 2024-04-02 DIAGNOSIS — K5000 Crohn's disease of small intestine without complications: Secondary | ICD-10-CM | POA: Insufficient documentation

## 2024-04-02 LAB — BASIC METABOLIC PANEL WITH GFR
Anion gap: 7 (ref 5–15)
BUN: 45 mg/dL — ABNORMAL HIGH (ref 8–23)
CO2: 21 mmol/L — ABNORMAL LOW (ref 22–32)
Calcium: 8.1 mg/dL — ABNORMAL LOW (ref 8.9–10.3)
Chloride: 109 mmol/L (ref 98–111)
Creatinine, Ser: 3.36 mg/dL — ABNORMAL HIGH (ref 0.61–1.24)
GFR, Estimated: 19 mL/min — ABNORMAL LOW (ref >60)
Glucose, Bld: 163 mg/dL — ABNORMAL HIGH (ref 70–99)
Potassium: 3.6 mmol/L (ref 3.5–5.1)
Sodium: 137 mmol/L (ref 135–145)

## 2024-04-02 LAB — CBC
HCT: 31.5 % — ABNORMAL LOW (ref 39.0–52.0)
Hemoglobin: 10.7 g/dL — ABNORMAL LOW (ref 13.0–17.0)
MCH: 29.5 pg (ref 26.0–34.0)
MCHC: 34 g/dL (ref 30.0–36.0)
MCV: 86.8 fL (ref 80.0–100.0)
Platelets: 181 10*3/uL (ref 150–400)
RBC: 3.63 MIL/uL — ABNORMAL LOW (ref 4.22–5.81)
RDW: 13.7 % (ref 11.5–15.5)
WBC: 6.2 10*3/uL (ref 4.0–10.5)
nRBC: 0 % (ref 0.0–0.2)

## 2024-04-02 LAB — GLUCOSE, CAPILLARY
Glucose-Capillary: 149 mg/dL — ABNORMAL HIGH (ref 70–99)
Glucose-Capillary: 172 mg/dL — ABNORMAL HIGH (ref 70–99)
Glucose-Capillary: 212 mg/dL — ABNORMAL HIGH (ref 70–99)
Glucose-Capillary: 177 mg/dL — ABNORMAL HIGH (ref 70–99)

## 2024-04-02 MED ORDER — OXYCODONE-ACETAMINOPHEN 5-325 MG PO TABS
1.0000 | ORAL_TABLET | Freq: Four times a day (QID) | ORAL | Status: DC | PRN
  Administered 2024-04-02 – 2024-04-05 (×9): 1 via ORAL
  Filled 2024-04-02 (×10): qty 1

## 2024-04-02 MED ORDER — LOSARTAN POTASSIUM 50 MG PO TABS
50.0000 mg | ORAL_TABLET | Freq: Every day | ORAL | Status: DC
  Administered 2024-04-02 – 2024-04-03 (×2): 50 mg via ORAL
  Filled 2024-04-02 (×2): qty 1

## 2024-04-02 MED ORDER — CIPROFLOXACIN HCL 500 MG PO TABS
500.0000 mg | ORAL_TABLET | Freq: Every day | ORAL | Status: DC
  Administered 2024-04-02 – 2024-04-05 (×4): 500 mg via ORAL
  Filled 2024-04-02 (×4): qty 1

## 2024-04-02 MED ORDER — PANTOPRAZOLE SODIUM 40 MG PO TBEC
40.0000 mg | DELAYED_RELEASE_TABLET | Freq: Every day | ORAL | Status: DC
  Administered 2024-04-02 – 2024-04-05 (×4): 40 mg via ORAL
  Filled 2024-04-02 (×4): qty 1

## 2024-04-02 MED ORDER — SODIUM CHLORIDE 0.9 % IV SOLN: INTRAVENOUS | Status: DC

## 2024-04-02 MED ORDER — METRONIDAZOLE 500 MG PO TABS
500.0000 mg | ORAL_TABLET | Freq: Two times a day (BID) | ORAL | Status: DC
  Administered 2024-04-02 – 2024-04-05 (×7): 500 mg via ORAL
  Filled 2024-04-02 (×7): qty 1

## 2024-04-02 NOTE — Progress Notes (Addendum)
 Progress Note   Patient: Angel Freeman ZOX:[id number] DOB: 08/14/1956 DOA: 04/01/2024     1 DOS: the patient was seen and examined on 04/02/2024   Brief hospital course: Mr. Angel Freeman is a 68 year old male with history of hypertension, non-insulin-dependent diabetes mellitus, depression, hyperlipidemia, severe sleep apnea on CPAP with Epworth Sleepiness score 14, history of MSSA bacteremia, history of diabetic left mid foot ulcer, who presents emergency department for chief concerns of no urination, abdominal pain, nausea, vomiting that started since yesterday.  He also had a diarrhea for the last 4 days.  He had minimal p.o. intake.  By time he came to the hospital, his creatinine was 3.58, baseline creatinine 2.23 on 08/2023. CT scan abdomen/pelvis showed inflammation of terminal ileus.  Patient treated with IV fluids, also added to oral antibiotics with Cipro and Flagyl.      Principal Problem:   Acute renal failure superimposed on stage 3b chronic kidney disease (HCC) Active Problems:   Essential hypertension   Nausea and vomiting   Type 2 diabetes mellitus without complication (HCC)   CKD stage 3b, GFR 30-44 ml/min (HCC)   Hyperlipidemia   Class 3 obesity   Diarrhea   Metabolic acidosis   Assessment and Plan:  * Acute renal failure superimposed on stage 3b chronic kidney disease (HCC) Metabolic acidosis. Hyponatremia. This is secondary to dehydration from nausea vomiting and poor p.o. intake and diarrhea. Renal function studies improving after fluids.  Continue fluids, may be able to discharge tomorrow.  Nausea vomiting diarrhea. Ileitis.  Possible Crohn disease. CT scan showed significant ileitis at the terminal ileus.  Possible Crohn disease.  Patient had significant nausea vomiting diarrhea.  I will treat her with Cipro and Flagyl for course. Patient will be followed with GI for colonoscopy after discharge.  Essential hypertension Blood pressure running high again  after giving amlodipine, restart losartan.  Type 2 diabetes mellitus without complication (HCC) Hold oral diabetic medicines, continue sliding scale insulin.  Hyperlipidemia Rosuvastatin 40 mg nightly resumed  Class 3 obesity This complicates overall care and prognosis.       Subjective:  Patient still has a poor appetite, but the nausea vomiting is better.  Has not had a bowel movement since admission.  Physical Exam: Vitals:   04/01/24 1942 04/02/24 0332 04/02/24 0819 04/02/24 1008  BP: (!) 177/89 (!) 185/69 (!) 187/77 (!) 165/66  Pulse: 83 66 73   Resp: 20 20 16    Temp: 98.3 F (36.8 C) 98.1 F (36.7 C) 97.8 F (36.6 C)   TempSrc: Oral Oral Oral   SpO2: 98% 96% 98%   Weight:      Height:       General exam: Appears calm and comfortable  Respiratory system: Clear to auscultation. Respiratory effort normal. Cardiovascular system: S1 & S2 heard, RRR. No JVD, murmurs, rubs, gallops or clicks. No pedal edema. Gastrointestinal system: Abdomen is nondistended, soft and Right mid abd tender. No organomegaly or masses felt. Normal bowel sounds heard. Central nervous system: Alert and oriented. No focal neurological deficits. Extremities: Symmetric 5 x 5 power. Skin: No rashes, lesions or ulcers Psychiatry:  Mood & affect appropriate.    Data Reviewed:  Reviewed CT scan results lab results.  Family Communication: Fianc updated at bedside.  Disposition: Status is: Inpatient Remains inpatient appropriate because: Severity of disease, IV treatment.     Time spent: 50 minutes  Author: Donaciano Frizzle, MD 04/02/2024 10:57 AM  For on call review [email or web address].

## 2024-04-02 NOTE — Plan of Care (Signed)

## 2024-04-02 NOTE — Care Management Important Message (Signed)
 Important Message  Patient Details  Name: Angel Freeman MRN: [id number] Date of Birth: July 08, 1956   Important Message Given:  Yes - Medicare IM     Anise Kerns 04/02/2024, 1:09 PM

## 2024-04-03 DIAGNOSIS — I10 Essential (primary) hypertension: Secondary | ICD-10-CM | POA: Diagnosis not present

## 2024-04-03 DIAGNOSIS — N1832 Chronic kidney disease, stage 3b: Secondary | ICD-10-CM | POA: Diagnosis not present

## 2024-04-03 DIAGNOSIS — N178 Other acute kidney failure: Secondary | ICD-10-CM | POA: Diagnosis not present

## 2024-04-03 DIAGNOSIS — K50019 Crohn's disease of small intestine with unspecified complications: Secondary | ICD-10-CM | POA: Diagnosis not present

## 2024-04-03 LAB — BASIC METABOLIC PANEL WITH GFR
Anion gap: 8 (ref 5–15)
BUN: 38 mg/dL — ABNORMAL HIGH (ref 8–23)
CO2: 19 mmol/L — ABNORMAL LOW (ref 22–32)
Calcium: 8 mg/dL — ABNORMAL LOW (ref 8.9–10.3)
Chloride: 111 mmol/L (ref 98–111)
Creatinine, Ser: 3.14 mg/dL — ABNORMAL HIGH (ref 0.61–1.24)
GFR, Estimated: 21 mL/min — ABNORMAL LOW (ref >60)
Glucose, Bld: 154 mg/dL — ABNORMAL HIGH (ref 70–99)
Potassium: 4 mmol/L (ref 3.5–5.1)
Sodium: 138 mmol/L (ref 135–145)

## 2024-04-03 LAB — GLUCOSE, CAPILLARY
Glucose-Capillary: 211 mg/dL — ABNORMAL HIGH (ref 70–99)
Glucose-Capillary: 216 mg/dL — ABNORMAL HIGH (ref 70–99)
Glucose-Capillary: 126 mg/dL — ABNORMAL HIGH (ref 70–99)
Glucose-Capillary: 165 mg/dL — ABNORMAL HIGH (ref 70–99)

## 2024-04-03 LAB — MAGNESIUM: Magnesium: 1.9 mg/dL (ref 1.7–2.4)

## 2024-04-03 MED ORDER — INSULIN GLARGINE-YFGN 100 UNIT/ML ~~LOC~~ SOLN
6.0000 [IU] | Freq: Every day | SUBCUTANEOUS | Status: DC
  Administered 2024-04-03 – 2024-04-04 (×2): 6 [IU] via SUBCUTANEOUS
  Filled 2024-04-03 (×3): qty 0.06

## 2024-04-03 MED ORDER — LOSARTAN POTASSIUM 50 MG PO TABS
100.0000 mg | ORAL_TABLET | Freq: Every day | ORAL | Status: DC
  Administered 2024-04-04 – 2024-04-05 (×2): 100 mg via ORAL
  Filled 2024-04-03 (×2): qty 2

## 2024-04-03 MED ORDER — SODIUM BICARBONATE 650 MG PO TABS
1300.0000 mg | ORAL_TABLET | Freq: Two times a day (BID) | ORAL | Status: DC
  Administered 2024-04-03 – 2024-04-04 (×3): 1300 mg via ORAL
  Filled 2024-04-03 (×3): qty 2

## 2024-04-03 NOTE — Plan of Care (Signed)
  Problem: Education: Goal: Ability to describe self-care measures that may prevent or decrease complications (Diabetes Survival Skills Education) will improve Outcome: Progressing   Problem: Coping: Goal: Ability to adjust to condition or change in health will improve Outcome: Progressing   Problem: Metabolic: Goal: Ability to maintain appropriate glucose levels will improve Outcome: Progressing   Problem: Nutritional: Goal: Maintenance of adequate nutrition will improve Outcome: Progressing   Problem: Skin Integrity: Goal: Risk for impaired skin integrity will decrease Outcome: Progressing   

## 2024-04-03 NOTE — Progress Notes (Signed)
 Progress Note   Patient: Angel Freeman:[id number] DOB: [birth year] DOA: 04/01/2024     2 DOS: the patient was seen and examined on 04/03/2024   Brief hospital course: Mr. Angel Freeman is a 68 year old male with history of hypertension, non-insulin -dependent diabetes mellitus, depression, hyperlipidemia, severe sleep apnea on CPAP with Epworth Sleepiness score 14, history of MSSA bacteremia, history of diabetic left mid foot ulcer, who presents emergency department for chief concerns of no urination, abdominal pain, nausea, vomiting that started since yesterday.  He also had a diarrhea for the last 4 days.  He had minimal p.o. intake.  By time he came to the hospital, his creatinine was 3.58, baseline creatinine 2.23 on 08/2023. CT scan abdomen/pelvis showed inflammation of terminal ileus.  Patient treated with IV fluids, also added to oral antibiotics with Cipro  and Flagyl .      Principal Problem:   Acute renal failure superimposed on stage 3b chronic kidney disease (HCC) Active Problems:   Essential hypertension   Nausea and vomiting   Type 2 diabetes mellitus without complication (HCC)   CKD stage 3b, GFR 30-44 ml/min (HCC)   Hyperlipidemia   Class 3 obesity   Diarrhea   Metabolic acidosis   Ileitis, terminal (HCC)   Assessment and Plan:    * Acute renal failure superimposed on stage 3b chronic kidney disease (HCC) Metabolic acidosis. Hyponatremia. This is secondary to dehydration from nausea vomiting and poor p.o. intake and diarrhea.  Renal function continued to improve, but still much higher than baseline.  Continue another day of IV fluids.  May be able to discharge tomorrow.   Nausea vomiting diarrhea. Ileitis.  Possible Crohn disease. CT scan showed significant ileitis at the terminal ileus.  Possible Crohn disease.  Patient had significant nausea vomiting diarrhea.  I will treat her with Cipro  and Flagyl  for a course. Patient will be followed with GI for  colonoscopy after discharge.   Essential hypertension Blood pressure running high again after giving amlodipine , increase losartan  to 100 mg daily.   Type 2 diabetes mellitus without complication (HCC) Hold oral diabetic medicines, continue sliding scale insulin .  Added insulin  glargine at 6 units every evening.   Hyperlipidemia Rosuvastatin  40 mg nightly resumed   Class 3 obesity BMI 41.27 This complicates overall care and prognosis.      Subjective:  Patient doing well, diarrhea essentially resolved.  Still has a poor appetite, but no nausea vomiting.  Physical Exam: Vitals:   04/02/24 2015 04/03/24 0412 04/03/24 1002 04/03/24 1015  BP: (!) 172/69 (!) 188/80 (!) 188/80 (!) 196/86  Pulse: 75 75    Resp: 16 20    Temp: 97.7 F (36.5 C) 97.7 F (36.5 C)    TempSrc: Oral Oral    SpO2: 96% 96%    Weight:      Height:       General exam: Appears calm and comfortable, morbid obese. Respiratory system: Clear to auscultation. Respiratory effort normal. Cardiovascular system: S1 & S2 heard, RRR. No JVD, murmurs, rubs, gallops or clicks. No pedal edema. Gastrointestinal system: Abdomen is nondistended, soft and nontender. No organomegaly or masses felt. Normal bowel sounds heard. Central nervous system: Alert and oriented. No focal neurological deficits. Extremities: Symmetric 5 x 5 power. Skin: No rashes, lesions or ulcers Psychiatry: Judgement and insight appear normal. Mood & affect appropriate.    Data Reviewed:  Lab results reviewed.  Family Communication: None  Disposition: Status is: Inpatient Remains inpatient appropriate because: Severity of disease,  IV treatment.     Time spent: 35 minutes  Author: Donaciano Frizzle, MD 04/03/2024 12:23 PM  For on call review [email or web address].

## 2024-04-04 DIAGNOSIS — N1832 Chronic kidney disease, stage 3b: Secondary | ICD-10-CM | POA: Diagnosis not present

## 2024-04-04 DIAGNOSIS — N178 Other acute kidney failure: Secondary | ICD-10-CM | POA: Diagnosis not present

## 2024-04-04 DIAGNOSIS — I10 Essential (primary) hypertension: Secondary | ICD-10-CM | POA: Diagnosis not present

## 2024-04-04 DIAGNOSIS — K50019 Crohn's disease of small intestine with unspecified complications: Secondary | ICD-10-CM | POA: Diagnosis not present

## 2024-04-04 LAB — BASIC METABOLIC PANEL WITH GFR
Anion gap: 7 (ref 5–15)
BUN: 31 mg/dL — ABNORMAL HIGH (ref 8–23)
CO2: 19 mmol/L — ABNORMAL LOW (ref 22–32)
Calcium: 7.6 mg/dL — ABNORMAL LOW (ref 8.9–10.3)
Chloride: 112 mmol/L — ABNORMAL HIGH (ref 98–111)
Creatinine, Ser: 3.13 mg/dL — ABNORMAL HIGH (ref 0.61–1.24)
GFR, Estimated: 21 mL/min — ABNORMAL LOW (ref >60)
Glucose, Bld: 149 mg/dL — ABNORMAL HIGH (ref 70–99)
Potassium: 4.1 mmol/L (ref 3.5–5.1)
Sodium: 138 mmol/L (ref 135–145)

## 2024-04-04 LAB — GLUCOSE, CAPILLARY
Glucose-Capillary: 167 mg/dL — ABNORMAL HIGH (ref 70–99)
Glucose-Capillary: 173 mg/dL — ABNORMAL HIGH (ref 70–99)
Glucose-Capillary: 142 mg/dL — ABNORMAL HIGH (ref 70–99)
Glucose-Capillary: 139 mg/dL — ABNORMAL HIGH (ref 70–99)
Glucose-Capillary: 209 mg/dL — ABNORMAL HIGH (ref 70–99)

## 2024-04-04 LAB — MAGNESIUM: Magnesium: 1.7 mg/dL (ref 1.7–2.4)

## 2024-04-04 MED ORDER — SENNOSIDES-DOCUSATE SODIUM 8.6-50 MG PO TABS
2.0000 | ORAL_TABLET | Freq: Two times a day (BID) | ORAL | Status: DC
  Administered 2024-04-04 – 2024-04-05 (×3): 2 via ORAL
  Filled 2024-04-04 (×3): qty 2

## 2024-04-04 MED ORDER — CIPROFLOXACIN HCL 500 MG PO TABS: 500.0000 mg | ORAL_TABLET | Freq: Every day | ORAL | 0 refills | Status: AC

## 2024-04-04 MED ORDER — ONDANSETRON HCL 4 MG PO TABS
4.0000 mg | ORAL_TABLET | Freq: Four times a day (QID) | ORAL | Status: DC | PRN
Start: 2024-04-04 — End: 2024-04-05

## 2024-04-04 MED ORDER — ONDANSETRON HCL 4 MG/2ML IJ SOLN
4.0000 mg | INTRAMUSCULAR | Status: DC | PRN
  Administered 2024-04-05: 4 mg via INTRAVENOUS
  Filled 2024-04-04: qty 2

## 2024-04-04 MED ORDER — LACTULOSE 10 GM/15ML PO SOLN
20.0000 g | Freq: Once | ORAL | Status: AC
  Administered 2024-04-04: 20 g via ORAL
  Filled 2024-04-04: qty 30

## 2024-04-04 MED ORDER — LOSARTAN POTASSIUM 50 MG PO TABS: 100.0000 mg | ORAL_TABLET | Freq: Every day | ORAL | Status: AC

## 2024-04-04 MED ORDER — METRONIDAZOLE 500 MG PO TABS: 500.0000 mg | ORAL_TABLET | Freq: Two times a day (BID) | ORAL | 0 refills | Status: AC

## 2024-04-04 MED ORDER — STERILE WATER FOR INJECTION IV SOLN
INTRAVENOUS | Status: DC
  Filled 2024-04-04: qty 1000
  Filled 2024-04-04 (×2): qty 150

## 2024-04-04 MED ORDER — SODIUM CHLORIDE 0.9 % IV SOLN
12.5000 mg | Freq: Four times a day (QID) | INTRAVENOUS | Status: DC | PRN
  Administered 2024-04-04: 12.5 mg via INTRAVENOUS
  Filled 2024-04-04: qty 12.5

## 2024-04-04 MED ORDER — SODIUM BICARBONATE 650 MG PO TABS: 1300.0000 mg | ORAL_TABLET | Freq: Two times a day (BID) | ORAL | 0 refills | Status: AC

## 2024-04-04 NOTE — Progress Notes (Signed)
 Become nauseous, vomited 1 time.  Will hold off discharge.  He has not had a bowel movement and become constipated since last diarrhea.  Will start stool softener.  He still has some mild metabolic acidosis, I will give her a dose of sodium bicarb recheck labs tomorrow.

## 2024-04-04 NOTE — Discharge Summary (Signed)
 Physician Discharge Summary   Patient: Angel Freeman MRN: [id number] DOB: 07/20/1956  Admit date:     04/01/2024  Discharge date: 04/04/24  Discharge Physician: Donaciano Frizzle   PCP: Claudine Cullens, MD   Recommendations at discharge:   Follow-up with nephrologist in [facility] as scheduled, Check a BMP next visit. Follow-up with PCP in 1 week. Refer to GI to be seen in 1 month.  Discharge Diagnoses: Principal Problem:   Acute renal failure superimposed on stage 3b chronic kidney disease (HCC) Active Problems:   Essential hypertension   Nausea and vomiting   Type 2 diabetes mellitus without complication (HCC)   CKD stage 3b, GFR 30-44 ml/min (HCC)   Hyperlipidemia   Class 3 obesity   Diarrhea   Metabolic acidosis   Ileitis, terminal (HCC)  Resolved Problems:   * No resolved hospital problems. [facility] Course: Angel Freeman is a 68 year old male with history of hypertension, non-insulin -dependent diabetes mellitus, depression, hyperlipidemia, severe sleep apnea on CPAP with Epworth Sleepiness score 14, history of MSSA bacteremia, history of diabetic left mid foot ulcer, who presents emergency department for chief concerns of no urination, abdominal pain, nausea, vomiting that started since yesterday.  He also had a diarrhea for the last 4 days.  He had minimal p.o. intake.  By time he came to the hospital, his creatinine was 3.58, baseline creatinine 2.23 on 08/2023. CT scan abdomen/pelvis showed inflammation of terminal ileus.  Patient treated with IV fluids, also added to oral antibiotics with Cipro  and Flagyl .   Patient condition had improved, no additional diarrhea.  Creatinine dropped down to 2.31, appears to be plateaued.  This could be the new baseline.  Patient is medically stable to be discharged, he has been followed by [facility] nephrology, will go back to them after discharge.  Assessment and Plan:   Acute renal failure superimposed on stage 3b chronic kidney disease  (HCC) Metabolic acidosis. Hyponatremia. This is secondary to dehydration from nausea vomiting and poor p.o. intake and diarrhea.   Renal function continued to improve, but still much higher than baseline a year ago.  This could be the new baseline.  Patient need to follow-up with PCP and nephrology as outpatient.  Will continue sodium bicarb orally for metabolic acidosis secondary to chronic kidney disease. Patient currently is medically stable for discharge.   Nausea vomiting diarrhea. Ileitis.  Possible Crohn disease. CT scan showed significant ileitis at the terminal ileus.  Possible Crohn disease.  Patient had significant nausea vomiting diarrhea.  I will treat her with Cipro  and Flagyl  for a course. Patient be referred to to GI to be seen in the month for repeat colonoscopy.   Essential hypertension Resume home treatment, but discontinued lisinopril.  Losartan  will be increased to 100 mg daily.  Type 2 diabetes mellitus without complication (HCC) Resume home treatment, follow-up with PCP to adjust medications. Discontinued metformin due to worsening renal function.  Hyperlipidemia Rosuvastatin  40 mg nightly resumed   Class 3 obesity BMI 41.27 This complicates overall care and prognosis.           Consultants: None Procedures performed: None  Disposition: Home Diet recommendation:  Discharge Diet Orders (From admission, onward)     Start     Ordered   04/04/24 0000  Diet - low sodium heart healthy        04/04/24 0938           Cardiac diet DISCHARGE MEDICATION: Allergies as of 04/04/2024  Reactions   Codeine Rash        Medication List     STOP taking these medications    cetirizine 10 MG tablet Commonly known as: ZYRTEC   COMBIVENT IN   glipiZIDE 10 MG tablet Commonly known as: GLUCOTROL   lisinopril 5 MG tablet Commonly known as: ZESTRIL   metFORMIN 1000 MG tablet Commonly known as: GLUCOPHAGE       TAKE these medications     albuterol  108 (90 Base) MCG/ACT inhaler Commonly known as: VENTOLIN  HFA Inhale into the lungs every 6 (six) hours as needed for wheezing or shortness of breath.   amLODipine  10 MG tablet Commonly known as: NORVASC  Take 10 mg by mouth daily.   ARIPiprazole  2 MG tablet Commonly known as: ABILIFY  Take 1 tablet by mouth daily.   Breztri  Aerosphere 160-9-4.8 MCG/ACT Aero inhaler Generic drug: budesonide -glycopyrrolate -formoterol  Inhale 2 puffs into the lungs in the morning and at bedtime.   busPIRone  10 MG tablet Commonly known as: BUSPAR  Take 10 mg by mouth 2 (two) times daily.   ciprofloxacin  500 MG tablet Commonly known as: CIPRO  Take 1 tablet (500 mg total) by mouth daily with breakfast for 5 days. Start taking on: April 05, 2024   diphenhydramine -acetaminophen  25-500 MG Tabs tablet Commonly known as: TYLENOL  PM Take 2 tablets by mouth at bedtime as needed.   empagliflozin 25 MG Tabs tablet Commonly known as: JARDIANCE Take 1 tablet by mouth daily.   fluticasone  50 MCG/ACT nasal spray Commonly known as: FLONASE  Place into both nostrils daily as needed for allergies or rhinitis.   gabapentin  300 MG capsule Commonly known as: NEURONTIN  Take 300 mg by mouth 2 (two) times daily. 300 mg at lunchtime.  600 mg at bedtime   hydrALAZINE  10 MG tablet Commonly known as: APRESOLINE  Take 10 mg by mouth 2 (two) times daily.   losartan  50 MG tablet Commonly known as: COZAAR  Take 50 mg by mouth daily.   metroNIDAZOLE  500 MG tablet Commonly known as: FLAGYL  Take 1 tablet (500 mg total) by mouth every 12 (twelve) hours for 5 days.   nitroGLYCERIN  0.4 MG SL tablet Commonly known as: NITROSTAT  Place 0.4 mg under the tongue every 5 (five) minutes as needed.   omeprazole 20 MG capsule Commonly known as: PRILOSEC Take 20 mg by mouth daily.   phenylephrine 1 % nasal spray Commonly known as: NEO-SYNEPHRINE Place 1 drop into both nostrils every 6 (six) hours as needed for  congestion. [employer] - 4 way nose spray   prochlorperazine 10 MG tablet Commonly known as: COMPAZINE Take 10 mg by mouth every 8 (eight) hours as needed for nausea.   rosuvastatin  40 MG tablet Commonly known as: CRESTOR  Take 1 tablet by mouth daily.   sertraline  25 MG tablet Commonly known as: ZOLOFT  Take 25 mg by mouth in the morning and at bedtime.   sitaGLIPtin 50 MG tablet Commonly known as: JANUVIA Take 50 mg by mouth daily.   sodium bicarbonate 650 MG tablet Take 2 tablets (1,300 mg total) by mouth 2 (two) times daily for 14 days.   tamsulosin 0.4 MG Caps capsule Commonly known as: FLOMAX Take 0.4 mg by mouth daily.   TRULICITY [place] Inject into the skin once a week. Thursday        Follow-up Information     Claudine Cullens, MD Follow up in 1 week(s).   Specialty: Family Medicine Contact information: [address] [place] [address] [phone]  Luke Salaam, MD Follow up in 1 month(s).   Specialty: Gastroenterology Contact information: [address] [place] [address] [place] [address] [phone]                Discharge Exam: Cleavon Curls Weights   04/01/24 1040  Weight: 105.7 kg   General exam: Appears calm and comfortable, morbid obese  Respiratory system: Clear to auscultation. Respiratory effort normal. Cardiovascular system: S1 & S2 heard, RRR. No JVD, murmurs, rubs, gallops or clicks. No pedal edema. Gastrointestinal system: Abdomen is nondistended, soft and nontender. No organomegaly or masses felt. Normal bowel sounds heard. Central nervous system: Alert and oriented. No focal neurological deficits. Extremities: Symmetric 5 x 5 power. Skin: No rashes, lesions or ulcers Psychiatry: Judgement and insight appear normal. Mood & affect appropriate.    Condition at discharge: fair  The results of significant diagnostics from this hospitalization (including imaging, microbiology, ancillary and laboratory) are listed below for  reference.   Imaging Studies: CT ABDOMEN PELVIS WO CONTRAST Result Date: 04/01/2024 CLINICAL DATA:  Abdominal pain. EXAM: CT ABDOMEN AND PELVIS WITHOUT CONTRAST TECHNIQUE: Multidetector CT imaging of the abdomen and pelvis was performed following the standard protocol without IV contrast. RADIATION DOSE REDUCTION: This exam was performed according to the departmental dose-optimization program which includes automated exposure control, adjustment of the mA and/or kV according to patient size and/or use of iterative reconstruction technique. COMPARISON:  None Available. FINDINGS: Evaluation of this exam is limited in the absence of intravenous contrast. Lower chest: The visualized lung bases are clear. There is coronary vascular calcification. No intra-abdominal free air. Small free fluid in the pelvis and adjacent to the liver. Hepatobiliary: The liver is unremarkable. No biliary dilatation. The gallbladder is unremarkable. Pancreas: Unremarkable. No pancreatic ductal dilatation or surrounding inflammatory changes. Spleen: Normal in size without focal abnormality. Adrenals/Urinary Tract: The adrenal glands are unremarkable. There is a 6 mm nonobstructing right renal interpolar calculus. No hydronephrosis. The left kidney is unremarkable. The visualized ureters and urinary bladder appear unremarkable. Stomach/Bowel: There is inflammatory changes and thickening of the terminal ileum which may be infectious in etiology but concerning for underlying inflammatory bowel disease. Correlation with history of Crohn's is recommended. Several mildly dilated small bowel in the left hemiabdomen, likely reactive ileus. There is no bowel obstruction. The appendix is normal. Vascular/Lymphatic: Mild aortoiliac atherosclerotic disease. The IVC is unremarkable. No portal venous gas. There is no adenopathy. Reproductive: The prostate and seminal vesicles are grossly remarkable. Other: None Musculoskeletal: Degenerative changes of  the spine. No acute osseous pathology. IMPRESSION: 1. Inflammatory changes and thickening of the terminal ileum concerning for underlying inflammatory bowel disease. Correlation with history of Crohn's is recommended. 2. No bowel obstruction. Normal appendix. 3. A 6 mm nonobstructing right renal interpolar calculus. No hydronephrosis. 4.  Aortic Atherosclerosis (ICD10-I70.0). Electronically Signed   By: Angus Bark M.D.   On: 04/01/2024 12:15    Microbiology: Results for orders placed or performed during the hospital encounter of 04/27/20  SARS CORONAVIRUS 2 (TAT 6-24 HRS) Nasopharyngeal Nasopharyngeal Swab     Status: None   Collection Time: 04/27/20 11:51 AM   Specimen: Nasopharyngeal Swab  Result Value Ref Range Status   SARS Coronavirus 2 NEGATIVE NEGATIVE Final    Comment: (NOTE) SARS-CoV-2 target nucleic acids are NOT DETECTED.  The SARS-CoV-2 RNA is generally detectable in upper and lower respiratory specimens during the acute phase of infection. Negative results do not preclude SARS-CoV-2 infection, do not rule out co-infections with other pathogens,  and should not be used as the sole basis for treatment or other patient management decisions. Negative results must be combined with clinical observations, patient history, and epidemiological information. The expected result is Negative.  Fact Sheet for Patients: HairSlick.no  Fact Sheet for Healthcare Providers: quierodirigir.com  This test is not yet approved or cleared by the [place]  FDA and  has been authorized for detection and/or diagnosis of SARS-CoV-2 by FDA under an Emergency Use Authorization (EUA). This EUA will remain  in effect (meaning this test can be used) for the duration of the COVID-19 declaration under Se ction 564(b)(1) of the Act, 21 U.S.C. section 360bbb-3(b)(1), unless the authorization is terminated or revoked sooner.  Performed at [facility], [address], [place], [place] [address]     Labs: CBC: Recent Labs  Lab 04/01/24 1051 04/02/24 0503  WBC 7.8 6.2  HGB 12.5* 10.7*  HCT 40.0 31.5*  MCV 94.8 86.8  PLT 191 181   Basic Metabolic Panel: Recent Labs  Lab 04/01/24 1051 04/02/24 0503 04/03/24 0502 04/04/24 0545  NA 134* 137 138 138  K 4.2 3.6 4.0 4.1  CL 109 109 111 112*  CO2 15* 21* 19* 19*  GLUCOSE 211* 163* 154* 149*  BUN 47* 45* 38* 31*  CREATININE 3.59* 3.36* 3.14* 3.13*  CALCIUM  7.8* 8.1* 8.0* 7.6*  MG  --   --  1.9 1.7   Liver Function Tests: Recent Labs  Lab 04/01/24 1051  AST 27  ALT 15  ALKPHOS 74  BILITOT 1.4*  PROT 6.2*  ALBUMIN 2.7*   CBG: Recent Labs  Lab 04/03/24 0956 04/03/24 1148 04/03/24 1709 04/03/24 2047 04/04/24 0828  GLUCAP 211* 216* 126* 165* 167*    Discharge time spent: greater than 30 minutes.  Signed: Donaciano Frizzle, MD [facility] 04/04/2024

## 2024-04-04 NOTE — Progress Notes (Signed)
 Pt sleeping with eyes closed. Fluids continued at 100 ml/hour.

## 2024-04-04 NOTE — Plan of Care (Signed)
 Neuro: A&O x 4, alert, follows commands Cardiac: pt's BP hypertensive throughout shift, MD notified. Pt vomited after morning medication administration, unsure if meds came up. Hydralazine  IV given x 1 with little affect.  GI: pt unable to keep food down, vomited after breakfast and lunch, MD notified. Phenergan IV given with positive affect. Family brought pt [employer] for dinner and pt successfully ate without vomiting. Bicarb gtt started this shift.   Discharge status discontinued in am due to high blood pressure and continued vomiting.     Problem: Education: Goal: Ability to describe self-care measures that may prevent or decrease complications (Diabetes Survival Skills Education) will improve Outcome: Progressing Goal: Individualized Educational Video(s) Outcome: Progressing   Problem: Coping: Goal: Ability to adjust to condition or change in health will improve Outcome: Progressing   Problem: Fluid Volume: Goal: Ability to maintain a balanced intake and output will improve Outcome: Progressing   Problem: Health Behavior/Discharge Planning: Goal: Ability to identify and utilize available resources and services will improve Outcome: Progressing Goal: Ability to manage health-related needs will improve Outcome: Progressing   Problem: Metabolic: Goal: Ability to maintain appropriate glucose levels will improve Outcome: Progressing   Problem: Nutritional: Goal: Maintenance of adequate nutrition will improve Outcome: Progressing Goal: Progress toward achieving an optimal weight will improve Outcome: Progressing   Problem: Skin Integrity: Goal: Risk for impaired skin integrity will decrease Outcome: Progressing   Problem: Tissue Perfusion: Goal: Adequacy of tissue perfusion will improve Outcome: Progressing   Problem: Education: Goal: Knowledge of General Education information will improve Description: Including pain rating scale, medication(s)/side effects and  non-pharmacologic comfort measures Outcome: Progressing   Problem: Health Behavior/Discharge Planning: Goal: Ability to manage health-related needs will improve Outcome: Progressing   Problem: Clinical Measurements: Goal: Ability to maintain clinical measurements within normal limits will improve Outcome: Progressing Goal: Will remain free from infection Outcome: Progressing Goal: Diagnostic test results will improve Outcome: Progressing Goal: Respiratory complications will improve Outcome: Progressing Goal: Cardiovascular complication will be avoided Outcome: Progressing   Problem: Activity: Goal: Risk for activity intolerance will decrease Outcome: Progressing   Problem: Nutrition: Goal: Adequate nutrition will be maintained Outcome: Progressing   Problem: Coping: Goal: Level of anxiety will decrease Outcome: Progressing   Problem: Elimination: Goal: Will not experience complications related to bowel motility Outcome: Progressing Goal: Will not experience complications related to urinary retention Outcome: Progressing   Problem: Pain Managment: Goal: General experience of comfort will improve and/or be controlled Outcome: Progressing   Problem: Safety: Goal: Ability to remain free from injury will improve Outcome: Progressing   Problem: Skin Integrity: Goal: Risk for impaired skin integrity will decrease Outcome: Progressing

## 2024-04-05 ENCOUNTER — Inpatient Hospital Stay

## 2024-04-05 DIAGNOSIS — N1832 Chronic kidney disease, stage 3b: Secondary | ICD-10-CM | POA: Diagnosis not present

## 2024-04-05 DIAGNOSIS — K50019 Crohn's disease of small intestine with unspecified complications: Secondary | ICD-10-CM | POA: Diagnosis not present

## 2024-04-05 DIAGNOSIS — E872 Acidosis, unspecified: Secondary | ICD-10-CM | POA: Diagnosis not present

## 2024-04-05 DIAGNOSIS — N178 Other acute kidney failure: Secondary | ICD-10-CM | POA: Diagnosis not present

## 2024-04-05 LAB — BASIC METABOLIC PANEL WITH GFR
Anion gap: 7 (ref 5–15)
BUN: 28 mg/dL — ABNORMAL HIGH (ref 8–23)
CO2: 22 mmol/L (ref 22–32)
Calcium: 7.7 mg/dL — ABNORMAL LOW (ref 8.9–10.3)
Chloride: 107 mmol/L (ref 98–111)
Creatinine, Ser: 3.12 mg/dL — ABNORMAL HIGH (ref 0.61–1.24)
GFR, Estimated: 21 mL/min — ABNORMAL LOW (ref >60)
Glucose, Bld: 151 mg/dL — ABNORMAL HIGH (ref 70–99)
Potassium: 3.9 mmol/L (ref 3.5–5.1)
Sodium: 136 mmol/L (ref 135–145)

## 2024-04-05 LAB — GLUCOSE, CAPILLARY
Glucose-Capillary: 129 mg/dL — ABNORMAL HIGH (ref 70–99)
Glucose-Capillary: 213 mg/dL — ABNORMAL HIGH (ref 70–99)

## 2024-04-05 LAB — URINALYSIS, COMPLETE (UACMP) WITH MICROSCOPIC
Bacteria, UA: NONE SEEN
Bilirubin Urine: NEGATIVE
Glucose, UA: 500 mg/dL — AB
Ketones, ur: NEGATIVE mg/dL
Leukocytes,Ua: NEGATIVE
Nitrite: NEGATIVE
Protein, ur: >300 mg/dL — AB
Specific Gravity, Urine: 1.02 (ref 1.005–1.030)
pH: 7 (ref 5.0–8.0)

## 2024-04-05 LAB — PHOSPHORUS: Phosphorus: 4.3 mg/dL (ref 2.5–4.6)

## 2024-04-05 LAB — MAGNESIUM: Magnesium: 1.8 mg/dL (ref 1.7–2.4)

## 2024-04-05 MED ORDER — CHLORHEXIDINE GLUCONATE CLOTH 2 % EX PADS: 6.0000 | MEDICATED_PAD | Freq: Every day | CUTANEOUS | Status: DC

## 2024-04-05 MED ORDER — SENNOSIDES-DOCUSATE SODIUM 8.6-50 MG PO TABS: 2.0000 | ORAL_TABLET | Freq: Two times a day (BID) | ORAL | 0 refills | Status: AC | PRN

## 2024-04-05 MED ORDER — TAMSULOSIN HCL 0.4 MG PO CAPS: 0.4000 mg | ORAL_CAPSULE | Freq: Every day | ORAL | 0 refills | Status: AC

## 2024-04-05 NOTE — Plan of Care (Signed)

## 2024-04-05 NOTE — Discharge Summary (Signed)
 Physician Discharge Summary   Patient: Angel Freeman MRN: [id number] DOB: 01-17-1956  Admit date:     04/01/2024  Discharge date: 04/05/24  Discharge Physician: Donaciano Frizzle   PCP: Claudine Cullens, MD   Recommendations at discharge:   Follow-up with PCP in 1 week. Follow-up with GI in 2 to 3 weeks. Follow-up with urology in 2 weeks. Follow-up with nephrology as [facility] as previous scheduled.  Discharge Diagnoses: Principal Problem:   Acute renal failure superimposed on stage 3b chronic kidney disease (HCC) Active Problems:   Essential hypertension   Nausea and vomiting   Type 2 diabetes mellitus without complication (HCC)   CKD stage 3b, GFR 30-44 ml/min (HCC)   Hyperlipidemia   Class 3 obesity   Diarrhea   Metabolic acidosis   Ileitis, terminal (HCC)  Resolved Problems:   * No resolved hospital problems. [facility] Course: Mr. [name] is a 68 year old male with history of hypertension, non-insulin -dependent diabetes mellitus, depression, hyperlipidemia, severe sleep apnea on CPAP with Epworth Sleepiness score 14, history of MSSA bacteremia, history of diabetic left mid foot ulcer, who presents emergency department for chief concerns of no urination, abdominal pain, nausea, vomiting that started since yesterday.  He also had a diarrhea for the last 4 days.  He had minimal p.o. intake.  By time he came to the hospital, his creatinine was 3.58, baseline creatinine 2.23 on 08/2023. CT scan abdomen/pelvis showed inflammation of terminal ileus.  Patient treated with IV fluids, also added to oral antibiotics with Cipro  and Flagyl .   Patient become very nauseous yesterday, was given stool softener, had a large bowel movement.  Nausea vomiting improved.  Patient also appeared to have a urinary tension, residuals 375 mL, Foley anchored.  Patient will follow with urology as outpatient.  Assessment and Plan: Acute renal failure superimposed on stage 3b chronic kidney disease  (HCC) Metabolic acidosis. Hyponatremia. Urinary retention secondary to benign prostate hypertrophy. This is secondary to dehydration from nausea vomiting and poor p.o. intake and diarrhea.   Renal function continued to improve, but still much higher than baseline a year ago.  But the patient symptom has improved.  Patient was found to have urinary retention, Foley catheter was anchored.  Follow-up with urology as outpatient.   Nausea vomiting diarrhea. Ileitis.  Possible Crohn disease. CT scan showed significant ileitis at the terminal ileus.  Possible Crohn disease.  Patient had significant nausea vomiting diarrhea.  I will treat her with Cipro  and Flagyl  for a course. Patient be referred to to GI to be seen in the month for repeat colonoscopy.   Essential hypertension Resume home treatment, but discontinued lisinopril.  Losartan  will be increased to 100 mg daily.   Type 2 diabetes mellitus without complication (HCC) Resume home treatment, follow-up with PCP to adjust medications. Discontinued metformin due to worsening renal function.   Hyperlipidemia Rosuvastatin  40 mg nightly resumed   Class 3 obesity BMI 41.27 This complicates overall care and prognosis.   Non obstructing kidney stone. CT scan and a renal ultrasound confirms no obstructive kidney stone, but no hydronephrosis.  Follow-up with urology as outpatient.             Consultants: None Procedures performed: None  Disposition: Home Diet recommendation:  Discharge Diet Orders (From admission, onward)     Start     Ordered   04/04/24 0000  Diet - low sodium heart healthy        04/04/24 4098  Cardiac diet DISCHARGE MEDICATION: Allergies as of 04/05/2024       Reactions   Codeine Rash        Medication List     STOP taking these medications    cetirizine 10 MG tablet Commonly known as: ZYRTEC   COMBIVENT IN   glipiZIDE 10 MG tablet Commonly known as: GLUCOTROL   lisinopril 5 MG  tablet Commonly known as: ZESTRIL   metFORMIN 1000 MG tablet Commonly known as: GLUCOPHAGE       TAKE these medications    albuterol  108 (90 Base) MCG/ACT inhaler Commonly known as: VENTOLIN  HFA Inhale into the lungs every 6 (six) hours as needed for wheezing or shortness of breath.   amLODipine  10 MG tablet Commonly known as: NORVASC  Take 10 mg by mouth daily.   ARIPiprazole  2 MG tablet Commonly known as: ABILIFY  Take 1 tablet by mouth daily.   Breztri  Aerosphere 160-9-4.8 MCG/ACT Aero inhaler Generic drug: budesonide -glycopyrrolate -formoterol  Inhale 2 puffs into the lungs in the morning and at bedtime.   busPIRone  10 MG tablet Commonly known as: BUSPAR  Take 10 mg by mouth 2 (two) times daily.   ciprofloxacin  500 MG tablet Commonly known as: CIPRO  Take 1 tablet (500 mg total) by mouth daily with breakfast for 5 days.   diphenhydramine -acetaminophen  25-500 MG Tabs tablet Commonly known as: TYLENOL  PM Take 2 tablets by mouth at bedtime as needed.   empagliflozin 25 MG Tabs tablet Commonly known as: JARDIANCE Take 1 tablet by mouth daily.   fluticasone  50 MCG/ACT nasal spray Commonly known as: FLONASE  Place into both nostrils daily as needed for allergies or rhinitis.   gabapentin  300 MG capsule Commonly known as: NEURONTIN  Take 300 mg by mouth 2 (two) times daily. 300 mg at lunchtime.  600 mg at bedtime   hydrALAZINE  10 MG tablet Commonly known as: APRESOLINE  Take 10 mg by mouth 2 (two) times daily.   losartan  50 MG tablet Commonly known as: COZAAR  Take 2 tablets (100 mg total) by mouth daily. What changed: how much to take   metroNIDAZOLE  500 MG tablet Commonly known as: FLAGYL  Take 1 tablet (500 mg total) by mouth every 12 (twelve) hours for 5 days.   nitroGLYCERIN  0.4 MG SL tablet Commonly known as: NITROSTAT  Place 0.4 mg under the tongue every 5 (five) minutes as needed.   omeprazole 20 MG capsule Commonly known as: PRILOSEC Take 20 mg by mouth  daily.   phenylephrine 1 % nasal spray Commonly known as: NEO-SYNEPHRINE Place 1 drop into both nostrils every 6 (six) hours as needed for congestion. [employer] - 4 way nose spray   prochlorperazine 10 MG tablet Commonly known as: COMPAZINE Take 10 mg by mouth every 8 (eight) hours as needed for nausea.   rosuvastatin  40 MG tablet Commonly known as: CRESTOR  Take 1 tablet by mouth daily.   senna-docusate 8.6-50 MG tablet Commonly known as: Senokot-S Take 2 tablets by mouth 2 (two) times daily as needed for mild constipation.   sertraline  25 MG tablet Commonly known as: ZOLOFT  Take 25 mg by mouth in the morning and at bedtime.   sitaGLIPtin 50 MG tablet Commonly known as: JANUVIA Take 50 mg by mouth daily.   sodium bicarbonate 650 MG tablet Take 2 tablets (1,300 mg total) by mouth 2 (two) times daily for 14 days.   tamsulosin 0.4 MG Caps capsule Commonly known as: FLOMAX Take 1 capsule (0.4 mg total) by mouth daily.   TRULICITY [place] Inject into the skin once a week.  Thursday        Follow-up Information     Claudine Cullens, MD Follow up in 1 week(s).   Specialty: Family Medicine Contact information: [address] [place] [address] [phone]         Luke Salaam, MD Follow up in 1 month(s).   Specialty: Gastroenterology Contact information: [address] [place] [address] [phone]         Geraline Knapp, MD Follow up in 2 week(s).   Specialty: Urology Contact information: [address] [place] [address] [phone]                Discharge Exam: Cleavon Curls Weights   04/01/24 1040  Weight: 105.7 kg   General exam: Appears calm and comfortable  Respiratory system: Clear to auscultation. Respiratory effort normal. Cardiovascular system: S1 & S2 heard, RRR. No JVD, murmurs, rubs, gallops or clicks. No pedal edema. Gastrointestinal system: Abdomen is nondistended, soft and nontender. No  organomegaly or masses felt. Normal bowel sounds heard. Central nervous system: Alert and oriented. No focal neurological deficits. Extremities: Symmetric 5 x 5 power. Skin: No rashes, lesions or ulcers Psychiatry: Judgement and insight appear normal. Mood & affect appropriate.    Condition at discharge: good  The results of significant diagnostics from this hospitalization (including imaging, microbiology, ancillary and laboratory) are listed below for reference.   Imaging Studies: US  RENAL Result Date: 04/05/2024 CLINICAL DATA:  History of kidney stones EXAM: RENAL / URINARY TRACT ULTRASOUND COMPLETE COMPARISON:  CT scan of the abdomen and pelvis 04/01/2024 FINDINGS: Right Kidney: Renal measurements: 12.2 x 6.0 x 5.9 cm = volume: 226 mL. Slightly increased parenchymal echogenicity with increased conspicuity of the corticomedullary interface. Echogenic focus in the interpolar collecting system demonstrates posterior acoustic shadowing consistent with a stone measuring approximately 0.8 cm. Tiny simple cyst in the lower pole measuring 0.6 x 0.5 x 0.5 cm. Left Kidney: Renal measurements: 11.7 x 6.6 x 6.1 cm = volume: 251 mL. Mildly increased parenchymal echogenicity. No hydronephrosis. No nephrolithiasis or mass. Bladder: Appears normal for degree of bladder distention. Other: None. IMPRESSION: 1. Nonobstructed 8 mm stone in the interpolar right renal collecting system. 2. Mildly increased echogenicity of the renal parenchyma bilaterally suggests underlying medical renal disease. 3. No evidence of hydronephrosis or solid renal mass. Electronically Signed   By: Fernando Hoyer M.D.   On: 04/05/2024 14:35   CT ABDOMEN PELVIS WO CONTRAST Result Date: 04/01/2024 CLINICAL DATA:  Abdominal pain. EXAM: CT ABDOMEN AND PELVIS WITHOUT CONTRAST TECHNIQUE: Multidetector CT imaging of the abdomen and pelvis was performed following the standard protocol without IV contrast. RADIATION DOSE REDUCTION: This exam was  performed according to the departmental dose-optimization program which includes automated exposure control, adjustment of the mA and/or kV according to patient size and/or use of iterative reconstruction technique. COMPARISON:  None Available. FINDINGS: Evaluation of this exam is limited in the absence of intravenous contrast. Lower chest: The visualized lung bases are clear. There is coronary vascular calcification. No intra-abdominal free air. Small free fluid in the pelvis and adjacent to the liver. Hepatobiliary: The liver is unremarkable. No biliary dilatation. The gallbladder is unremarkable. Pancreas: Unremarkable. No pancreatic ductal dilatation or surrounding inflammatory changes. Spleen: Normal in size without focal abnormality. Adrenals/Urinary Tract: The adrenal glands are unremarkable. There is a 6 mm nonobstructing right renal interpolar calculus. No hydronephrosis. The left kidney is unremarkable. The visualized ureters and urinary bladder appear unremarkable. Stomach/Bowel: There is  inflammatory changes and thickening of the terminal ileum which may be infectious in etiology but concerning for underlying inflammatory bowel disease. Correlation with history of Crohn's is recommended. Several mildly dilated small bowel in the left hemiabdomen, likely reactive ileus. There is no bowel obstruction. The appendix is normal. Vascular/Lymphatic: Mild aortoiliac atherosclerotic disease. The IVC is unremarkable. No portal venous gas. There is no adenopathy. Reproductive: The prostate and seminal vesicles are grossly remarkable. Other: None Musculoskeletal: Degenerative changes of the spine. No acute osseous pathology. IMPRESSION: 1. Inflammatory changes and thickening of the terminal ileum concerning for underlying inflammatory bowel disease. Correlation with history of Crohn's is recommended. 2. No bowel obstruction. Normal appendix. 3. A 6 mm nonobstructing right renal interpolar calculus. No  hydronephrosis. 4.  Aortic Atherosclerosis (ICD10-I70.0). Electronically Signed   By: Angus Bark M.D.   On: 04/01/2024 12:15    Microbiology: Results for orders placed or performed during the hospital encounter of 04/27/20  SARS CORONAVIRUS 2 (TAT 6-24 HRS) Nasopharyngeal Nasopharyngeal Swab     Status: None   Collection Time: 04/27/20 11:51 AM   Specimen: Nasopharyngeal Swab  Result Value Ref Range Status   SARS Coronavirus 2 NEGATIVE NEGATIVE Final    Comment: (NOTE) SARS-CoV-2 target nucleic acids are NOT DETECTED.  The SARS-CoV-2 RNA is generally detectable in upper and lower respiratory specimens during the acute phase of infection. Negative results do not preclude SARS-CoV-2 infection, do not rule out co-infections with other pathogens, and should not be used as the sole basis for treatment or other patient management decisions. Negative results must be combined with clinical observations, patient history, and epidemiological information. The expected result is Negative.  Fact Sheet for Patients: HairSlick.no  Fact Sheet for Healthcare Providers: quierodirigir.com  This test is not yet approved or cleared by the [place]  FDA and  has been authorized for detection and/or diagnosis of SARS-CoV-2 by FDA under an Emergency Use Authorization (EUA). This EUA will remain  in effect (meaning this test can be used) for the duration of the COVID-19 declaration under Se ction 564(b)(1) of the Act, 21 U.S.C. section 360bbb-3(b)(1), unless the authorization is terminated or revoked sooner.  Performed at [facility], [address], [place], [place] [address]     Labs: CBC: Recent Labs  Lab 04/01/24 1051 04/02/24 0503  WBC 7.8 6.2  HGB 12.5* 10.7*  HCT 40.0 31.5*  MCV 94.8 86.8  PLT 191 181   Basic Metabolic Panel: Recent Labs  Lab 04/01/24 1051 04/02/24 0503 04/03/24 0502 04/04/24 0545  04/05/24 0528  NA 134* 137 138 138 136  K 4.2 3.6 4.0 4.1 3.9  CL 109 109 111 112* 107  CO2 15* 21* 19* 19* 22  GLUCOSE 211* 163* 154* 149* 151*  BUN 47* 45* 38* 31* 28*  CREATININE 3.59* 3.36* 3.14* 3.13* 3.12*  CALCIUM  7.8* 8.1* 8.0* 7.6* 7.7*  MG  --   --  1.9 1.7 1.8  PHOS  --   --   --   --  4.3   Liver Function Tests: Recent Labs  Lab 04/01/24 1051  AST 27  ALT 15  ALKPHOS 74  BILITOT 1.4*  PROT 6.2*  ALBUMIN 2.7*   CBG: Recent Labs  Lab 04/04/24 1501 04/04/24 1600 04/04/24 2056 04/05/24 0819 04/05/24 1127  GLUCAP 142* 139* 209* 129* 213*    Discharge time spent: greater than 30 minutes.  Signed: Donaciano Frizzle, MD [facility] 04/05/2024

## 2024-04-05 NOTE — TOC CM/SW Note (Signed)
 Transition of Care [facility]) - Inpatient Brief Assessment   Patient Details  Name: TESHAUN OLARTE MRN: [id number] Date of Birth: 03-15-1956  Transition of [facility]) CM/SW Contact:    Loman Risk, RN Phone Number: 04/05/2024, 2:53 PM   Clinical Narrative:   Transition of Care [facility]) Screening Note   Patient Details  Name: ABHIRAM CRIADO Date of Birth: [birth year]   Transition of Care [facility]) CM/SW Contact:    Loman Risk, RN Phone Number: 04/05/2024, 2:53 PM    Transition of Care Department [facility]) has reviewed patient and no TOC needs have been identified at this time.. If new patient transition needs arise, please place a TOC consult.  Per SDOH transportation resources added to AVS    Transition of Care Asessment: Insurance and Status: Insurance coverage has been reviewed Patient has primary care physician: Yes     Prior/Current Home Services: No current home services Social Drivers of Health Review: SDOH reviewed interventions complete Readmission risk has been reviewed: Yes Transition of care needs: no transition of care needs at this time

## 2024-04-05 NOTE — Discharge Instructions (Signed)

## 2024-04-16 ENCOUNTER — Other Ambulatory Visit: Payer: Self-pay

## 2024-04-16 ENCOUNTER — Emergency Department
Admission: EM | Admit: 2024-04-16 | Discharge: 2024-04-16 | Disposition: A | Discharge status: Home / Self Care | Attending: Emergency Medicine | Admitting: Emergency Medicine

## 2024-04-16 DIAGNOSIS — R102 Pelvic and perineal pain: Secondary | ICD-10-CM | POA: Diagnosis not present

## 2024-04-16 DIAGNOSIS — N368 Other specified disorders of urethra: Secondary | ICD-10-CM | POA: Insufficient documentation

## 2024-04-16 DIAGNOSIS — T8384XA Pain from genitourinary prosthetic devices, implants and grafts, initial encounter: Secondary | ICD-10-CM | POA: Insufficient documentation

## 2024-04-16 DIAGNOSIS — N369 Urethral disorder, unspecified: Secondary | ICD-10-CM | POA: Insufficient documentation

## 2024-04-16 DIAGNOSIS — N184 Chronic kidney disease, stage 4 (severe): Secondary | ICD-10-CM | POA: Insufficient documentation

## 2024-04-16 DIAGNOSIS — R3989 Other symptoms and signs involving the genitourinary system: Secondary | ICD-10-CM

## 2024-04-16 LAB — CBC
HCT: 28.8 % — ABNORMAL LOW (ref 39.0–52.0)
Hemoglobin: 9.4 g/dL — ABNORMAL LOW (ref 13.0–17.0)
MCH: 29.7 pg (ref 26.0–34.0)
MCHC: 32.6 g/dL (ref 30.0–36.0)
MCV: 90.9 fL (ref 80.0–100.0)
Platelets: 219 10*3/uL (ref 150–400)
RBC: 3.17 MIL/uL — ABNORMAL LOW (ref 4.22–5.81)
RDW: 13.9 % (ref 11.5–15.5)
WBC: 7.2 10*3/uL (ref 4.0–10.5)
nRBC: 0 % (ref 0.0–0.2)

## 2024-04-16 LAB — BASIC METABOLIC PANEL WITH GFR
Anion gap: 11 (ref 5–15)
BUN: 31 mg/dL — ABNORMAL HIGH (ref 8–23)
CO2: 19 mmol/L — ABNORMAL LOW (ref 22–32)
Calcium: 8.3 mg/dL — ABNORMAL LOW (ref 8.9–10.3)
Chloride: 109 mmol/L (ref 98–111)
Creatinine, Ser: 3.53 mg/dL — ABNORMAL HIGH (ref 0.61–1.24)
GFR, Estimated: 18 mL/min — ABNORMAL LOW (ref >60)
Glucose, Bld: 146 mg/dL — ABNORMAL HIGH (ref 70–99)
Potassium: 4.5 mmol/L (ref 3.5–5.1)
Sodium: 139 mmol/L (ref 135–145)

## 2024-04-16 NOTE — ED Notes (Signed)
 Patient tolerated Foley removal well. Foley was stuck on pubic hair from dried drainage, head of penis was also cleaned at this time.

## 2024-04-16 NOTE — Discharge Instructions (Addendum)
 Please return to the ER right away if you have difficulty urinating again, feel you cannot pass urine, have nausea or vomiting, develop fevers, abdominal pain or other concerns arise.  Follow-up with your specialist as planned on Tuesday.  Also set up close follow-up with your primary care.

## 2024-04-16 NOTE — ED Notes (Addendum)
 Pt called out because pt had voided in urinal, this tech went into pt's room and retrieved urine and sent sample off to lab. RN made aware.

## 2024-04-16 NOTE — ED Notes (Signed)
 Patient was given a urinal with instructions to call after voiding. Call light at bedside.

## 2024-04-16 NOTE — ED Provider Notes (Signed)
 [facility] Provider Note    Event Date/Time   First MD Initiated Contact with Patient 04/16/24 1723     (approximate)   History   medical device problem   HPI {Remember to add pertinent medical, surgical, social, and/or OB history to HPI:1} Angel Freeman is a 68 y.o. male  ***       Physical Exam   Triage Vital Signs: ED Triage Vitals  Encounter Vitals Group     BP 04/16/24 1715 (!) 149/75     Girls Systolic BP Percentile --      Girls Diastolic BP Percentile --      Boys Systolic BP Percentile --      Boys Diastolic BP Percentile --      Pulse Rate 04/16/24 1715 68     Resp 04/16/24 1715 16     Temp 04/16/24 1715 98.4 F (36.9 C)     Temp src --      SpO2 04/16/24 1715 96 %     Weight 04/16/24 1712 232 lb 12.9 oz (105.6 kg)     Height --      Head Circumference --      Peak Flow --      Pain Score 04/16/24 1712 8     Pain Loc --      Pain Education --      Exclude from Growth Chart --     Most recent vital signs: Vitals:   04/16/24 1715  BP: (!) 149/75  Pulse: 68  Resp: 16  Temp: 98.4 F (36.9 C)  SpO2: 96%    {Only need to document appropriate and relevant physical exam:1} General: Awake, no distress. *** CV:  Good peripheral perfusion. *** Resp:  Normal effort. *** Abd:  No distention. *** Other:  ***   ED Results / Procedures / Treatments   Labs (all labs ordered are listed, but only abnormal results are displayed) Labs Reviewed  CBC - Abnormal; Notable for the following components:      Result Value   RBC 3.17 (*)    Hemoglobin 9.4 (*)    HCT 28.8 (*)    All other components within normal limits  BASIC METABOLIC PANEL WITH GFR - Abnormal; Notable for the following components:   CO2 19 (*)    Glucose, Bld 146 (*)    BUN 31 (*)    Creatinine, Ser 3.53 (*)    Calcium  8.3 (*)    GFR, Estimated 18 (*)    All other components within normal limits  URINE CULTURE     EKG  ***   RADIOLOGY *** {USE  THE WORD INTERPRETED!! You MUST document your own interpretation of imaging, as well as the fact that you reviewed the radiologist's report!:1}   PROCEDURES:  Critical Care performed: {CriticalCareYesNo:19197::Yes, see critical care procedure note(s),No}  Procedures   MEDICATIONS ORDERED IN ED: Medications - No data to display   IMPRESSION / MDM / ASSESSMENT AND PLAN / ED COURSE  I reviewed the triage vital signs and the nursing notes.                              Differential diagnosis includes, but is not limited to, ***  Patient's presentation is most consistent with {EM COPA:27473}  *** {If the patient is on the monitor, remove the brackets and asterisks on the sentence below and remember to document it as a Procedure as  well. Otherwise delete the sentence below:1} {**The patient is on the cardiac monitor to evaluate for evidence of arrhythmia and/or significant heart rate changes.**} {Remember to include, when applicable, any/all of the following data: independent review of imaging independent review of labs (comment specifically on pertinent positives and negatives) review of specific prior hospitalizations, PCP/specialist notes, etc. discuss meds given and prescribed document any discussion with consultants (including hospitalists) any clinical decision tools you used and why (PECARN, NEXUS, etc.) did you consider admitting the patient? document social determinants of health affecting patient's care (homelessness, inability to follow up in a timely fashion, etc) document any pre-existing conditions increasing risk on current visit (e.g. diabetes and HTN increasing danger of high-risk chest pain/ACS) describes what meds you gave (especially parenteral) and why any other interventions?:1} Clinical Course as of 04/16/24 2018  Fri Apr 16, 2024  1738 Reviewed previous discharge summary.  Patient had acute kidney injury in the setting of ileitis, ultimately required  Foley catheter insertion [MQ]  1953 Patient reports after Foley catheter removed that he feels much better.  He was able to urinate and reports that he feels like he normally urinated.  He does not feel full or as though he is retaining any urine [MQ]  1953 Spoke with our nephrologist Dr. Erminio Hazy.  Reviewed the patient's history including historical labs, [MQ]  1954 Reports stage IV visit with [facility] and also primary care note renal dysfunction, nephrology suspects that patient has either chronic renal disease or slow to resolve episode of ATN.  He does advise follow-up on Tuesday with specialist as planned. [MQ]  1954 Patient reports that he has both follow-up with [facility] and lab testing planned for Tuesday for follow-up. [MQ]    Clinical Course User Index [MQ] Iver Marker, MD     FINAL CLINICAL IMPRESSION(S) / ED DIAGNOSES   Final diagnoses:  Urethral pain  Stage 4 chronic kidney disease (HCC)     Rx / DC Orders   ED Discharge Orders     None        Note:  This document was prepared using Dragon voice recognition software and may include unintentional dictation errors.

## 2024-04-16 NOTE — ED Notes (Signed)
Bladder Scan 73m

## 2024-04-16 NOTE — ED Triage Notes (Signed)
 Foley catheter placed 2 weeks ago.  C/O pain and pressure ? Catheter dislodged x 4 days.  Has appointment with urology on Tuesday.

## 2024-04-19 LAB — URINE CULTURE

## 2024-04-20 ENCOUNTER — Ambulatory Visit (INDEPENDENT_AMBULATORY_CARE_PROVIDER_SITE_OTHER): Admitting: Urology

## 2024-04-20 ENCOUNTER — Ambulatory Visit: Admitting: Physician Assistant

## 2024-04-20 VITALS — BP 186/73 | HR 75 | Wt 228.0 lb

## 2024-04-20 DIAGNOSIS — R300 Dysuria: Secondary | ICD-10-CM

## 2024-04-20 DIAGNOSIS — N184 Chronic kidney disease, stage 4 (severe): Secondary | ICD-10-CM

## 2024-04-20 DIAGNOSIS — R399 Unspecified symptoms and signs involving the genitourinary system: Secondary | ICD-10-CM

## 2024-04-20 LAB — BLADDER SCAN AMB NON-IMAGING

## 2024-04-20 MED ORDER — CLOTRIMAZOLE-BETAMETHASONE 1-0.05 % EX CREA: 1.0000 | TOPICAL_CREAM | Freq: Two times a day (BID) | CUTANEOUS | 0 refills | Status: AC

## 2024-04-20 MED ORDER — NITROFURANTOIN MONOHYD MACRO 100 MG PO CAPS: 100.0000 mg | ORAL_CAPSULE | Freq: Two times a day (BID) | ORAL | 0 refills | Status: AC

## 2024-04-20 NOTE — Progress Notes (Signed)
 04/20/24 8:21 AM   Angel Freeman 09-Apr-1956 [id number]  CC: Urinary retention, dysuria, penile pain  HPI: Comorbid 68 year old male with medical history notable for diabetes, CKD stage IV, vascular disease, severe sleep apnea on CPAP who was admitted 04/01/2024 through 04/05/2024 for ileitis, also found to have acute on chronic kidney injury.  CT scan and renal ultrasound showed no evidence of incomplete bladder emptying or hydronephrosis, however a bladder scan at some point was reportedly and a Foley catheter was placed.  He denied any urinary symptoms prior to hospitalization.  He actually returned to the ER on 6/13 and had the catheter removed as it was causing significant pain, PVR after having catheter removed in the ER was normal at 58ml.  He is having some penile discomfort and some burning with urination still.  Urine culture 6/13 showed 30K staph hemolyticus, he is not currently on antibiotics.  PVR today normal at 0ml.  PMH: Past Medical History:  Diagnosis Date   Arthritis    ankles   Brain aneurysm 1995   Complication of anesthesia 01-12-2019   Died on table during retina surgery   Diabetes mellitus, type 2 (HCC)    GERD (gastroesophageal reflux disease)    Hypertension    Poor circulation    legs   Seizure (HCC) 1995   with brain aneurysm.  None since.   Sleep apnea    no CPAP (yet)   Wears dentures    full upper    Surgical History: Past Surgical History:  Procedure Laterality Date   ANEURYSM COILING  1996   CATARACT EXTRACTION W/PHACO Left 05/01/2020   Procedure: CATARACT EXTRACTION PHACO AND INTRAOCULAR LENS PLACEMENT (IOC) LEFT INTRAVITREAL KENALOG  INJECTION DIABETIC 1.97  00:26.0;  Surgeon: Rosa College, MD;  Location: [facility];  Service: Ophthalmology;  Laterality: Left;  Diabetic - oral meds   intercranial coiling     RETINAL DETACHMENT SURGERY  2019/01/12     Family History: Family History  Problem Relation Age of Onset    Cancer Father    Macular degeneration Father     Social History:  reports that he has never smoked. He has never used smokeless tobacco. He reports current alcohol use. He reports that he does not currently use drugs.  Physical Exam: BP (!) 186/73 (BP Location: Left Arm, Patient Position: Sitting, Cuff Size: Normal)   Pulse 75   Wt 228 lb (103.4 kg)   SpO2 99%   BMI 40.39 kg/m    Constitutional:  Alert and oriented, No acute distress. Cardiovascular: No clubbing, cyanosis, or edema. Respiratory: Normal respiratory effort, no increased work of breathing. GI: Abdomen is soft, nontender, nondistended, no abdominal masses GU: Phallus with erythema concerning for balanitis  Laboratory Data: Reviewed in epic  Pertinent Imaging: I have personally viewed and interpreted the CT scan dated 04/01/2024 showing decompressed bladder, no hydronephrosis, nonobstructive right renal stone, prostate measures 29 g.  Assessment & Plan:   68 year old male recently hospitalized for ileitis and acute on chronic kidney injury, CT and ultrasound benign from a urologic perspective but reportedly had an elevated PVR of and a Foley catheter was placed.  Foley was since removed on 6/13 in the ER, urine culture did grow a small amount of staph hemolyticus, he is having some burning with urination and some penile pain consistent with balanitis.  No urinary symptoms prior to hospitalization.  I reviewed the hospitalization notes extensively.  Reassurance provided regarding normal CT scan with no evidence  of hydronephrosis or bladder distention.  I recommended a 1 week course of nitrofurantoin for his mild dysuria with Staph haemolyticus on recent urine culture, as well as a course of Lotrisone cream for likely balanitis after having the catheter in place.  Return precautions were discussed extensively.  Oxybutynin was discontinued, can finish course of Flomax  and consider resuming in the future if worsening urinary  symptoms.  RTC 3 months PVR, sooner if problems   Jay Meth, MD 04/20/2024  [facility] [address] [place], [place] [address] [phone]

## 2024-05-04 ENCOUNTER — Ambulatory Visit: Admitting: Urology

## 2024-05-04 ENCOUNTER — Ambulatory Visit: Admitting: Physician Assistant

## 2024-06-07 ENCOUNTER — Encounter: Payer: Self-pay | Admitting: Urology

## 2024-06-26 ENCOUNTER — Other Ambulatory Visit: Payer: Self-pay

## 2024-06-26 ENCOUNTER — Emergency Department
Admission: EM | Admit: 2024-06-26 | Discharge: 2024-06-26 | Disposition: A | Discharge status: Home / Self Care | Attending: Emergency Medicine | Admitting: Emergency Medicine

## 2024-06-26 ENCOUNTER — Emergency Department

## 2024-06-26 DIAGNOSIS — Y92007 Garden or yard of unspecified non-institutional (private) residence as the place of occurrence of the external cause: Secondary | ICD-10-CM | POA: Insufficient documentation

## 2024-06-26 DIAGNOSIS — W172XXA Fall into hole, initial encounter: Secondary | ICD-10-CM | POA: Diagnosis not present

## 2024-06-26 DIAGNOSIS — S9032XA Contusion of left foot, initial encounter: Secondary | ICD-10-CM | POA: Insufficient documentation

## 2024-06-26 DIAGNOSIS — E119 Type 2 diabetes mellitus without complications: Secondary | ICD-10-CM | POA: Diagnosis not present

## 2024-06-26 DIAGNOSIS — I10 Essential (primary) hypertension: Secondary | ICD-10-CM | POA: Diagnosis not present

## 2024-06-26 DIAGNOSIS — S52124A Nondisplaced fracture of head of right radius, initial encounter for closed fracture: Secondary | ICD-10-CM | POA: Diagnosis not present

## 2024-06-26 DIAGNOSIS — M25521 Pain in right elbow: Secondary | ICD-10-CM | POA: Diagnosis present

## 2024-06-26 MED ORDER — IBUPROFEN 600 MG PO TABS
600.0000 mg | ORAL_TABLET | Freq: Once | ORAL | Status: AC
  Administered 2024-06-26: 600 mg via ORAL
  Filled 2024-06-26: qty 1

## 2024-06-26 MED ORDER — ACETAMINOPHEN 500 MG PO TABS
1000.0000 mg | ORAL_TABLET | Freq: Once | ORAL | Status: AC
  Administered 2024-06-26: 1000 mg via ORAL
  Filled 2024-06-26: qty 2

## 2024-06-26 NOTE — ED Provider Notes (Signed)
 [facility] Provider Note    Event Date/Time   First MD Initiated Contact with Patient 06/26/24 0155     (approximate)   History   Foot Injury and Elbow Pain   HPI  Angel Freeman is a 69 y.o. male   Past medical history of type II diabetic, hypertension, prior left foot surgery who presents to the Emergency Department with a fall.  He was working in his yard and stepped in a hole twisted his left foot and fell onto his right elbow.  No head strike or loss of consciousness or any other injuries.  Pain in left foot and right elbow where he had his injuries as described above.  He has been able to ambulate despite the pain in his foot.  No other acute medical complaints  External Medical Documents Reviewed: Prior outpatient notes      Physical Exam   Triage Vital Signs: ED Triage Vitals  Encounter Vitals Group     BP 06/26/24 0101 (!) 184/74     Girls Systolic BP Percentile --      Girls Diastolic BP Percentile --      Boys Systolic BP Percentile --      Boys Diastolic BP Percentile --      Pulse Rate 06/26/24 0101 85     Resp 06/26/24 0101 18     Temp 06/26/24 0101 98.4 F (36.9 C)     Temp Source 06/26/24 0101 Oral     SpO2 06/26/24 0101 98 %     Weight 06/26/24 0059 235 lb (106.6 kg)     Height 06/26/24 0059 5' 3 (1.6 m)     Head Circumference --      Peak Flow --      Pain Score 06/26/24 0059 8     Pain Loc --      Pain Education --      Exclude from Growth Chart --     Most recent vital signs: Vitals:   06/26/24 0101  BP: (!) 184/74  Pulse: 85  Resp: 18  Temp: 98.4 F (36.9 C)  SpO2: 98%    General: Awake, no distress.  CV:  Good peripheral perfusion.  Resp:  Normal effort.  Abd:  No distention.  Other:  Old appearing well-healed surgical scar in the left foot, anterior midfoot tenderness to palpation without any external signs of injury.  Neurovascular intact.  Right sided elbow swelling.  Tenderness palpation around the  joint.  No warmth or erythema.  Neurovascular intact.  Head to toe examination reveals no other acute traumatic injuries.    Specifically, able to range at all joints aside from the affected right elbow.  No signs of head trauma.  No CT or L-spine tenderness to palpation, chest wall or abdominal tenderness to palpation.   ED Results / Procedures / Treatments   Labs (all labs ordered are listed, but only abnormal results are displayed) Labs Reviewed - No data to display    RADIOLOGY I independently reviewed and interpreted elbow x-ray on the right side and see joint effusion I also reviewed radiologist's formal read.   PROCEDURES:  Critical Care performed: No  Procedures   MEDICATIONS ORDERED IN ED: Medications  ibuprofen  (ADVIL ) tablet 600 mg (has no administration in time range)  acetaminophen  (TYLENOL ) tablet 1,000 mg (has no administration in time range)     IMPRESSION / MDM / ASSESSMENT AND PLAN / ED COURSE  I reviewed the triage vital signs  and the nursing notes.                                Patient's presentation is most consistent with acute presentation with potential threat to life or bodily function.  Differential diagnosis includes, but is not limited to, acute traumatic injury including fracture or dislocation of the elbow, fracture or dislocation or contusion of the left foot    MDM:    Left foot and elbow injury with a nondisplaced radial head fracture identified on x-ray.  No acute traumatic injuries noted on left foot x-ray.  Able to ambulate.  No other acute traumatic injuries noted by patient nor my exam.  Will put in sling, follow-up with primary doctor and/or orthopedist for the elbow injury.  Anticipatory guidance follow-up with his podiatrist for his left foot likely contusion or sprain.  Discharged in stable condition.  Return precautions given.       FINAL CLINICAL IMPRESSION(S) / ED DIAGNOSES   Final diagnoses:  Contusion of left foot,  initial encounter  Closed nondisplaced fracture of head of right radius, initial encounter     Rx / DC Orders   ED Discharge Orders     None        Note:  This document was prepared using Dragon voice recognition software and may include unintentional dictation errors.    Cyrena Mylar, MD 06/26/24 [phone]

## 2024-06-26 NOTE — ED Triage Notes (Signed)
 Pt arrived via POV with reports of stepping into hole while working in the yard, pt states he felt a pop in his L foot, and pain to R elbow with decreased ROM  Pt reports hx of surgery to L foot.  Pt denies hitting his head. AMbulatory in triage without difficulty.

## 2024-06-26 NOTE — ED Notes (Signed)
 The pt states he was mowing the grass when he stepped into a hole and fell. The pt states he heard a pop, and soon after started to complain of left leg pain and right elbow pain. No obvious deformities noted to either extremity.

## 2024-06-26 NOTE — Discharge Instructions (Addendum)
 Take acetaminophen  650 mg and ibuprofen  400 mg every 6 hours for pain.  Take with food. Apply ice to help with pain and swelling for 20 minutes at a time.  Keep your elbow in the sling for the next 2 to 3 days and meet with your regular doctor for an appointment.  I have given you the contact information for a bone and joint specialist as well as should your doctor advise that you meet with the bone and joint doctor for this type of injury.  Thank you for choosing us  for your health care today!  Please see your primary doctor this week for a follow up appointment.   If you have any new, worsening, or unexpected symptoms call your doctor right away or come back to the emergency department for reevaluation.  It was my pleasure to care for you today.   Ginnie EDISON Cyrena, MD

## 2024-06-26 NOTE — ED Notes (Signed)
 Patient transported to X-ray

## 2024-07-19 ENCOUNTER — Ambulatory Visit: Admitting: Urology

## 2024-07-20 ENCOUNTER — Ambulatory Visit: Admitting: Urology

## 2024-07-29 NOTE — Care Plan (Signed)
  Care Management Final Transition Planning Assessment    07/29/2024  Patient's Post Acute Contact Information: [phone] (H)     Has a PCP appointment been made?: No   Has a specialist appointment been made?: Yes Future Appointments  Date Time Provider Department Center  08/03/2024  4:00 PM [place], Selina, MD [facility]  08/13/2024 11:30 AM [place], Garnette Bruckner, DPM [facility]      Post Acute Facility needed at discharge?: No        Home Care/ Home Medical Equipment needed at discharge?: No           Outpatient/Community Referrals needed for discharge?: No     Agency detail (Name/Phone #): PCP Leita Alder Transportation Anticipated: family or friend will provide    Currently receiving outpatient dialysis?: No       Discharge Disposition: Home w/ Self Care     IMM Delivery Follow Up Important Message (IM) letter given to patient and/or family?: Yes IMM Delivered by: The IMM was delivered and signed by patient IMM Delivery Date: 07/30/24 IMM Delivery Time: 1147 The beneficiary verbalized understanding of the rights of the hospitalized patient and the right to appeal the discharge decision?: Yes The patient/authorized representative was advised they have 4 hours to consider their right to request a QIO review prior to leaving the hospital, if the physician orders discharge today.: Yes The patient/authorized representative verbalized understanding of this right.: Yes  Quality data for continuing care services shared with patient and/or representative?: Yes Patient and/or family were provided with choice of facilities / services that are available and appropriate to meet post hospital care needs?: Yes  List choices in order highest to lowest preferred, if applicable. : closest HD center to home-[facility] [address] [place] [address] phone [phone]     Final Assessment Complete: Yes                      Readmission Risk Score:  Predictive Model Details        27% (High)  Factor Value   Calculated 07/29/2024 12:05 24% Number of active inpatient medication orders 44   [facility] Risk of Unplanned Readmission Model 7% Active antipsychotic inpatient medication order present    7% ECG/EKG order present in last 6 months    6% Latest BUN high (43 mg/dL)    6% Diagnosis of electrolyte disorder present    5% Imaging order present in last 6 months    5% Latest hemoglobin low (9.5 g/dL)    5% Phosphorous result present    4% Number of ED visits in last six months 1    4% Age 68    4% Number of hospitalizations in last year 1    4% Charlson Comorbidity Index 4    4% Diagnosis of deficiency anemia present    4% Active anticoagulant inpatient medication order present    3% Latest creatinine high (4.46 mg/dL)    3% Diagnosis of renal failure present    2% Current length of stay 2.991 days    2% Future appointment scheduled    1% Active ulcer inpatient medication order present

## 2024-07-29 NOTE — Consults (Signed)
 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 Several admissions for AKI, urinary obstruction, and volume overload. Has had longstanding nephrotic range proteinuria. Suspect CKD secondary to severe diabetic nephropathy. Prior serologic work up has been done, and was negative. Has never had renal biopsy, and low utility at this point. Cr has increased to >4 this past month in setting of volume overload. No evidence of ongoing obstruction on renal US .  - Has received IV diuresis past 3 days with excellent UO, now dry. Start torsemide 60 mg daily. - Evaluated by surgery for AV access, which he will get as an outpatient. No future blood draws in right arm.  - Continue jardiance and losartan .    # Nausea, poor appetite, improving  BUN 43, Cr 4.4 possible that uremia or gut wall edema could be contributing.  Started mounjaro 10 mg this past month (switched from equivalent dose of trulicity due to GI side effects)- possible that this could be contributing to symptoms. Currently not receiving inpatient.  Symptoms are improving, will hold mounjaro for now.    #Metabolic Acidosis improved  #Anemia of CKD Hgb 9.5. Tsat 6%. Ferritin 43. Received ferrlecit 250 mg x 3 doses while admitted.   RECOMMENDATIONS:  - Start torsemide 60 mg daily. Will plan for close follow up early next week in [place].  - We will continue to follow.   Selina Pons, MD 07/29/2024    Medical decision-making for 07/29/24  Findings / Data    Patient has: []  acute illness w/systemic sxs  [mod] []  two or more stable chronic illnesses [mod] []  one chronic illness with acute exacerbation [mod] []  acute complicated illness  [mod] []  Undiagnosed new problem with  uncertain prognosis  [mod] []  illness posing risk to life or bodily function (ex. AKI)  [high] [x]  chronic illness with severe exacerbation/progression  [high] []  chronic illness with severe side effects of treatment  [high] Volume overload, CKD4/5 Probs At least 2:  Probs, Data, Risk  I reviewed: [x]  primary team note []  consultant note(s) []  external records [x]  chemistry results [x]  CBC results []  blood gas results []  Other []  procedure/op note(s)  []  radiology report(s) []  micro result(s) []  w/ independent historian(s) Hgb 9.5, Cr 4.4, BUN 43 >=3 Data Review (2 of 3)   I independently interpreted: []  Urine Sediment []  Renal US  []  CXR Images []  CT Images []  Other []  EKG Tracing  Any    I discussed: []  Pathology results w/ QHPs(s) from other specialties []  Procedural findings w/ QHPs(s) from other specialties []  Imaging w/ QHP(s) from other specialties [x]  Treatment plan w/ QHP(s) from other specialties Plan discussed with primary team Any    Mgm't requires: [x]  Prescription drug(s)  [mod] []  Kidney biopsy  [mod] []  Central line placement  [mod] []  High risk medication use and/or intensive toxicity monitoring [high] []  Renal replacement therapy [high] []  High risk kidney biopsy  [high] []  Escalation of care  [high] []  High risk central line placement  [high] PO diuretics Risk    _____________________________________________________________________________________  Subjective/Interval Events:  No evidence of obstruction on kidney US  yesterday.  Echo ws normal, was hyperdynamic. NO pericardial effusion Well diuresed, no edema. Appetite better, he feels ready to go  home.   Physical Exam:  Vitals:   07/28/24 2230 07/29/24 0000 07/29/24 0532 07/29/24 0745  BP: 147/66   159/69  Pulse: 68 68  66  Resp: 18   18  Temp: 36.7 C (98 F)   36.9 C (98.4 F)  TempSrc: Temporal   Oral  SpO2: 96%   96%  Weight:   89.3 kg (196 lb 12.8 oz)   Height:       No intake/output data  recorded.  Intake/Output Summary (Last 24 hours) at 07/29/2024 2243 Last data filed at 07/29/2024 1145 Gross per 24 hour  Intake --  Output 2125 ml  Net -2125 ml   Constitutional: well-appearing, no acute distress Heart: RRR, no m/r/g Lungs: CTAB, normal wob Abd: soft, non-tender, non-distended Zku:umjrz edema (improved)

## 2024-07-29 NOTE — Discharge Summary (Signed)
 Physician Discharge Summary [facility] 4 BT1 [facility] [address] [place] [address] Dept: [phone] Loc: [phone]   Identifying Information:  Angel Freeman 1956-04-10 [id number]  Primary Care Physician: Derick Leita POUR, MD  Code Status: Full Code  Admit Date: 07/26/2024  Discharge Date: 07/29/2024   Discharge To: Home  Discharge Service: HBR - HBR: Hospitalist [facility]   Discharge Attending Physician: Norleen Denese Drones, MD  Discharge Diagnoses: Principal Problem:   ESRD needing dialysis    (CMS-HCC) (POA: Yes) Active Problems:   Essential hypertension (POA: Yes)   Hyperlipidemia, unspecified hyperlipidemia type (POA: Yes)   Type 2 diabetes mellitus with other ophthalmic complication, without long-term current use of insulin     (CMS-HCC) (POA: Yes)   Severe obstructive sleep apnea (POA: Yes)   Swelling (POA: Yes)   Chronic heart failure with preserved ejection fraction    (CMS-HCC) (POA: Yes) Resolved Problems:   * No resolved hospital problems. *   Outpatient Provider Follow Up Issues:  CKD Diuretic dose  Hospital Course:  Angel Freeman is a 68 yo man with ESRD not on dialysis, nephrotic syndrome from DM, admitted with volume overload.  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  Creatinine on admission was 4.68 from a baseline of 2-4.  He was seen in consultation with nephrology.  Renal ultrasound showed medical renal disease, bladder scan was negative for obstruction.  TTE showed a preserved EF.  He was diuresed with IV Lasix 80 mg titrated to output and weight loss, transitioned to p.o. torsemide 60 mg at discharge. His creatinine at discharge was 4.46. He remains very reluctant to initiate hemodialysis and will be seen in close follow-up.  He was evaluated by vascular surgery and had vascular mapping ultrasound while  admitted in preparation for right upper extremity AV fistula.  He was continued on his home Jardiance, losartan . Discharge weight was 89.3 kg, down from 104.7 kg at admission.   Anemia with CKD He received three 250 mg doses of sodium ferric gluconate while hospitalized.   Hyperphosphatemia Started on phosphate binder sevalamer here, continued at discharge.   HTN BP stable on his home amlodipine , losartan , metoprolol.   DM2 Blood glucose stable.  He was continued on Jardiance and can resume Mounjaro at discharge.   HLD Statin continued.   BPH  Tamsulosin  continued.   Anxiety/depression He was continued on buspirone  10mg  BID, sertraline  100mg  BID, aripiprazole  2mg  daily.   Procedures:  No admission procedures for hospital encounter. ______________________________________________________________________ Discharge Medications:   Your Medication List     STOP taking these medications    diphenhydrAMINE -acetaminophen  25-500 mg Tab Commonly known as: TYLENOL  PM       START taking these medications    acetaminophen  325 MG tablet Commonly known as: TYLENOL  Take 2 tablets (650 mg total) by mouth every six (6) hours as needed.   sevelamer 800 mg tablet Commonly known as: RENVELA Take 1 tablet (800 mg total) by mouth Three (3) times a day with a meal.       CHANGE how you take these medications    torsemide 20 MG tablet Commonly known as: DEMADEX Take 3 tablets (60 mg total) by mouth daily. What changed: how much to take       CONTINUE taking these medications    amlodipine  10 MG tablet Commonly known as: NORVASC  Take 1 tablet (10 mg total) by mouth  in the morning.   aripiprazole  2 MG tablet Commonly known as: ABILIFY  Take 1 tablet (2 mg total) by mouth in the morning.   BREZTRI  AEROSPHERE 160-9-4.8 mcg/actuation inhaler Generic drug: budesonide -glycopyr-formoterol  Inhale 2 puffs two (2) times a day.   busPIRone  10 MG tablet Commonly known as:  BUSPAR  Take 1 tablet (10 mg total) by mouth two (2) times a day.   cetirizine 10 MG tablet Commonly known as: ZYRTEC Take 1 tablet (10 mg total) by mouth daily as needed.   empagliflozin 25 mg tablet Commonly known as: JARDIANCE Take 1 tablet (25 mg total) by mouth in the morning.   fluticasone  propionate 50 mcg/actuation nasal spray Commonly known as: FLONASE  two (2) times a day.   losartan  100 MG tablet Commonly known as: COZAAR  TAKE 1 TABLET BY MOUTH ONCE DAILY   metoPROLOL succinate 25 MG 24 hr tablet Commonly known as: TOPROL XL Take 1 tablet (25 mg total) by mouth daily.   MOUNJARO 10 mg/0.5 mL Pnij Generic drug: tirzepatide Inject 10 mg under the skin once a week.   omeprazole 40 MG capsule Commonly known as: PriLOSEC   prochlorperazine 10 MG tablet Commonly known as: COMPAZINE Take 1 tablet (10 mg total) by mouth every eight (8) hours as needed for nausea.   rosuvastatin  40 MG tablet Commonly known as: CRESTOR  Take 1 tablet (40 mg total) by mouth daily.   sertraline  100 MG tablet Commonly known as: ZOLOFT  Take 1 tablet (100 mg total) by mouth in the morning. Takes one and one-half dailly.   tamsulosin  0.4 mg capsule Commonly known as: FLOMAX  Take 1 capsule (0.4 mg total) by mouth in the morning.       ASK your doctor about these medications    nitroglycerin  0.4 MG SL tablet Commonly known as: NITROSTAT  Place 1 tablet (0.4 mg total) under the tongue every five (5) minutes as needed for chest pain. Maximum of 3 doses in 15 minutes.   pen needle, diabetic 32 gauge x 5/32 (4 mm) Ndle Commonly known as: BD ULTRA-FINE NANO PEN NEEDLE 1 each by Miscellaneous route Four (4) times a day (before meals and nightly).        Allergies: Codeine ______________________________________________________________________ Pending Test Results (if blank, then none):   Most Recent Labs: All lab results last 24 hours -  Recent Results (from the past 24 hours)   POCT Glucose   Collection Time: 07/28/24 11:55 AM  Result Value Ref Range   Glucose, POC 115 70 - 179 mg/dL  POCT Glucose   Collection Time: 07/28/24  5:28 PM  Result Value Ref Range   Glucose, POC 132 70 - 179 mg/dL  POCT Glucose   Collection Time: 07/28/24  9:31 PM  Result Value Ref Range   Glucose, POC 106 70 - 179 mg/dL  POCT Glucose   Collection Time: 07/29/24  7:53 AM  Result Value Ref Range   Glucose, POC 159 70 - 179 mg/dL  Basic Metabolic Panel   Collection Time: 07/29/24  8:04 AM  Result Value Ref Range   Sodium 143 135 - 145 mmol/L   Potassium 3.6 3.4 - 4.8 mmol/L   Chloride 105 98 - 107 mmol/L   CO2 22.4 20.0 - 31.0 mmol/L   Anion Gap 16 (H) 5 - 14 mmol/L   BUN 43 (H) 9 - 23 mg/dL   Creatinine 5.53 (H) 9.26 - 1.18 mg/dL   BUN/Creatinine Ratio 10    eGFR CKD-EPI (2021) Male 14 (L) >=60 mL/min/1.45m2  Glucose 173 70 - 179 mg/dL   Calcium  9.1 8.7 - 10.4 mg/dL    Relevant Studies/Radiology (if blank, then none): PVL Vessel Mapping For Hemodialysis Bilateral Result Date: 07/29/2024   Peripheral Vascular Lab     [address]   [place], [place] [address]  PVL VESSEL MAPPING FOR HEMODIALYSIS BILATERAL Patient Demographics Pt. Name: Angel Freeman Location: [facility] MRN:      [id number]       Sex:      CHRISTELLA DOB:      02-06-1956     Age:      89 years  Study Information Authorizing         [id number] SASHA          Performed Time       07/29/2024 Provider Name       KONDRASOV                                  8:10:58 AM Ordering Physician  VENA BECTON       Patient Location     [facility] Accession Number    [id number] UN        Technologist         Marissa                                                                Pachlhofer RVT Diagnosis:                                Assisting                                           Technologist Ordered Reason For Exam: Assess for possible AVF creation for dialysis History:        End stage renal disease. Hx of BUE PIVs. Hand  Dominance: Right hand dominant.  Final Interpretation  Right: Normal Doppler waveforms detected in the brachial, radial and        ulnar arteries. No plaque in the brachial artery. No plaque        in the radial artery. Left: Normal Doppler waveforms detected in the brachial, radial and       ulnar arteries. No plaque in the brachial artery. No plaque in       the radial artery.  Electronically signed by [id number] Almeda Dolores MD on 07/29/2024 at 9:17:33 AM.  Examination Protocol: The Duplex scanner is used on the requested extremity to obtain cross sectional diameter measurements of cephalic veins from the shoulder to the wrist and basilic veins from their origin to the elbow. Doppler spectral waveforms are obtained from subclavian, brachial, radial, and ulnar arteries. The brachial and radial arteries are scanned for the presence of plaque and diameter measurements are recorded.  +-----------------+-----------------+--------------+----------------+ Right            Cephalic DiameterCephalic DepthBasilic Diameter +-----------------+-----------------+--------------+----------------+          Shoulder           3.0 mm  6.9 mm                 +-----------------+-----------------+--------------+----------------+    Prox upper arm           2.9 mm        4.0 mm                 +-----------------+-----------------+--------------+----------------+     Mid upper arm           3.8 mm        3.3 mm3.0 mm           +-----------------+-----------------+--------------+----------------+    Dist upper arm           3.5 mm        4.0 mm4.0 mm           +-----------------+-----------------+--------------+----------------+ Antecubital fossa           4.6 mm        2.5 mm4.7 mm           +-----------------+-----------------+--------------+----------------+      Prox forearm           2.8 mm        3.5 mm                 +-----------------+-----------------+--------------+----------------+       Mid  forearm           2.3 mm        3.3 mm                 +-----------------+-----------------+--------------+----------------+      Dist forearm           1.8 mm        1.7 mm                 +-----------------+-----------------+--------------+----------------+             Wrist           1.8 mm        1.7 mm                 +-----------------+-----------------+--------------+----------------+ +-------------------+--------+ Right Other VesselsDiameter +-------------------+--------+ Ulnar Artery       2.0 mm   +-------------------+--------+ Right Basilic Vein Length = 8.0 cm Right Comment: All visualized veins appear fully compressible.  +-----------------+----------------+--------------+----------------+ Left             Cephalic        Cephalic DepthBasilic Diameter                  Diameter                                       +-----------------+----------------+--------------+----------------+          Shoulder                                               +-----------------+----------------+--------------+----------------+    Prox upper arm                                               +-----------------+----------------+--------------+----------------+     Mid upper arm  6.7 mm           +-----------------+----------------+--------------+----------------+    Dist upper arm                              4.9 mm           +-----------------+----------------+--------------+----------------+ Antecubital fossa                              4.4 mm           +-----------------+----------------+--------------+----------------+      Prox forearm          3.5 mm        3.0 mm                 +-----------------+----------------+--------------+----------------+       Mid forearm          3.1 mm        4.1 mm                 +-----------------+----------------+--------------+----------------+      Dist forearm          3.1 mm         3.7 mm                 +-----------------+----------------+--------------+----------------+             Wrist          3.0 mm        2.6 mm                 +-----------------+----------------+--------------+----------------+  +------------------+--------+ Left Other VesselsDiameter +------------------+--------+ Ulnar Artery      2.0 mm   +------------------+--------+ Basilic Vein Length = 8.0 cm Left Comment: The cephalic vein and median cubital vein appear partially compressible and softly echogenic at the ACF, suggestive of acute obstruction. The cephalic vein from the distal upper arm to the shoulder appears non compressible and retracted, suggestive of chronic obstruction. All remaining visualized veins appear fully compressible.  Arteries +-----------+-------+   +-------+            Right     Left    +-----------+-------+   +-------+                                                              Brachial 5.6 mm    4.8 mm                              +-----------+-------+   +-------+ Radial Prox2.4 mm    2.2 mm  +-----------+-------+   +-------+ Radial Mid 2.8 mm    2.1 mm  +-----------+-------+   +-------+ Radial Dist2.4 mm    2.5 mm  +-----------+-------+   +-------+ Arterial Findings Right: Duplex waveforms in the axillary, brachial, radial and ulnar artery        appear multiphasic with sharp systolic upstrokes. No plaque visualized in        the brachial artery. No plaque visualized in the radial artery. Left:  Duplex waveforms in the axillary, brachial, radial and ulnar artery  appear multiphasic with sharp systolic upstrokes. No plaque visualized in        the brachial artery. No plaque visualized in the radial artery.   Final   Echocardiogram W Colorflow Spectral Doppler With Contrast Result Date: 07/27/2024 Patient Info Name:     Angel Freeman Age:     68 years DOB:     [birth year] Gender:     Male MRN:     [id number] Accession #:      [id number] UN Account #:     [id number] Ht:     163 cm Wt:     99 kg BSA:     2.16 m2 BP:     156 /     71 mmHg HR:     64 bpm Exam Date:     07/27/2024 8:56 AM Admit Date:     07/26/2024 Exam Type:     ECHOCARDIOGRAM W COLORFLOW SPECTRAL DOPPLER W CONTRAST Technical Quality:     Fair Staff Sonographer:     Cletus Daring Ordering Physician:     Delon Amble [place] Study Info Indications      - pulmonary edema ; Bubble Study Procedure(s)   Complete two-dimensional, color flow and Doppler transthoracic echocardiogram is performed with agitated saline. Ultrasound Enhancing Agent/Agitated Saline ------------------------------ UEA/Ag. Saline:     Agitated Saline Amount:     16.00 ml Existing IV Access:     Yes IV Access Condition:     patent with no signs of infiltration Summary   1. The left ventricle is normal in size with upper normal wall thickness.   2. The left ventricular systolic function is hyperdynamic, LVEF is visually estimated at 65-70%.   3. The mitral valve leaflets are mildly thickened with normal leaflet mobility.   4. Mitral annular calcification is present (moderate).   5. The aortic valve is trileaflet with mildly thickened leaflets with normal excursion.   6. The left atrium is mildly dilated in size.   7. The right ventricle is mildly dilated in size, with normal systolic function.   8. There is no evidence of an interatrial flow communication or intrapulmonary shunt by agitated saline study. Left Ventricle   The left ventricle is normal in size with upper normal wall thickness. The left ventricular systolic function is hyperdynamic, LVEF is visually estimated at 65-70%. There is normal left ventricular diastolic function. Right Ventricle   The right ventricle is mildly dilated in size, with normal systolic function. Ventricular Septum   Abnormal ventricular septal motion consistent with right bundle branch block. Left Atrium   The left atrium is mildly dilated in size. Right Atrium   The right  atrium is normal in size. Aortic Valve   The aortic valve is trileaflet with mildly thickened leaflets with normal excursion. There is no significant aortic regurgitation. There is no evidence of a significant transvalvular gradient. Mitral Valve   The mitral valve leaflets are mildly thickened with normal leaflet mobility. Mitral annular calcification is present (moderate). There is no significant mitral valve regurgitation. Tricuspid Valve   The tricuspid valve leaflets are normal, with normal leaflet mobility. There is no significant tricuspid regurgitation. The pulmonary systolic pressure cannot be estimated due to insufficient TR signal. Pulmonic Valve   The pulmonic valve is normal. There is no significant pulmonic regurgitation. There is no evidence of a significant transvalvular gradient. Aorta   The aorta is normal in size in the visualized segments. Inferior Vena Cava   The IVC is not well  visualized precluding the ability to accurate assess right atrial pressure. Pericardium/Pleural   There is no pericardial effusion. Other Findings   Rhythm: Right Bundle Branch Block. There is no evidence of an interatrial flow communication or intrapulmonary shunt by agitated saline study. Ventricles ---------------------------------------------------------------------- Name                                 Value        Normal ---------------------------------------------------------------------- LV Dimensions 2D/MM ----------------------------------------------------------------------  IVS Diastolic Thickness (2D)                                1.1 cm       0.6-1.0 LVID Diastole (2D)                  4.7 cm       4.2-5.8  LVPW Diastolic Thickness (2D)                                1.2 cm       0.6-1.0 LVID Systole (2D)                   3.4 cm       2.5-4.0 LVOT Diameter                       1.5 cm               LV Mass Index (2D Cubed)           98 g/m2        49-115  Relative Wall Thickness (2D)                                   0.49        <=0.42 RV Dimensions 2D/MM ---------------------------------------------------------------------- TAPSE                               2.6 cm         >=1.7 Atria ---------------------------------------------------------------------- Name                                 Value        Normal ---------------------------------------------------------------------- LA Dimensions ---------------------------------------------------------------------- LA Dimension (2D)                   4.9 cm       3.0-4.1 LA Volume Index (4C A-L)        30.64 ml/m2               LA Volume Index (2C A-L)        44.72 ml/m2               LA Volume (BP MOD)                   78 ml               LA Volume Index (BP MOD)        36.02 ml/m2   16.00-34.00 RA Dimensions ---------------------------------------------------------------------- RA Area (4C)  16.1 cm2        <=18.0 RA Area (4C) Index              7.4 cm2/m2               RA ESV Index (4C MOD)             18 ml/m2         18-32 Left Ventricular Outflow Tract ---------------------------------------------------------------------- Name                                 Value        Normal ---------------------------------------------------------------------- LVOT 2D ---------------------------------------------------------------------- LVOT Diameter                       1.5 cm               LVOT Area                          1.8 cm2               LVOT Doppler ---------------------------------------------------------------------- LVOT VTI                             28 cm               LVOT Stroke Volume                   51 ml               LVOT SI                           24 ml/m2 Aortic Valve ---------------------------------------------------------------------- Name                                 Value        Normal ---------------------------------------------------------------------- AV Doppler  ---------------------------------------------------------------------- AV Mean Gradient                    8 mmHg               AV VTI                               49 cm               AV Area (Cont Eq VTI)              1.1 cm2         >=3.0 AV Area Index (Cont Eq VTI)     0.5 cm2/m2               AV DI (VTI)                           0.57 Mitral Valve ---------------------------------------------------------------------- Name                                 Value        Normal ---------------------------------------------------------------------- MV Diastolic Function ---------------------------------------------------------------------- MV E Peak Velocity  138 cm/s               MV A Peak Velocity                148 cm/s               MV E/A                                 0.9               MV Annular TDI ---------------------------------------------------------------------- MV Septal e' Velocity             7.3 cm/s         >=8.0 MV E/e' (Septal)                      19.0               MV Lateral e' Velocity           11.6 cm/s        >=10.0 MV E/e' (Lateral)                     11.9               MV e' Average                     9.4 cm/s               MV E/e' (Average)                     15.4 Pulmonic Valve ---------------------------------------------------------------------- Name                                 Value        Normal ---------------------------------------------------------------------- PV Doppler ---------------------------------------------------------------------- PV Peak Velocity                   1.0 m/s Aorta ---------------------------------------------------------------------- Name                                 Value        Normal ---------------------------------------------------------------------- Ascending Aorta ---------------------------------------------------------------------- Ao Root Diameter (2D)               3.6 cm               Ao Root Diam Index (2D)           1.6 cm/m2 Report Signatures Finalized by Jonette Honora Slocumb on 07/27/2024 12:50 PM  ECG 12 Lead Result Date: 07/26/2024 SINUS BRADYCARDIA WITH 1ST DEGREE AV BLOCK RIGHT BUNDLE BRANCH BLOCK ABNORMAL ECG WHEN COMPARED WITH ECG OF 04-Aug-2023 22:08, NONSPECIFIC T WAVE ABNORMALITY HAS REPLACED INVERTED T WAVES IN ANTERIOR LEADS Confirmed by Leni Mings ([id number]) on 07/26/2024 8:32:37 PM  US  Renal Complete Addendum Date: 07/26/2024 Mildly echogenic kidneys, likely related to medical renal disease.  Result Date: 07/26/2024 EXAM: US  RENAL COMPLETE ACCESSION: [id number] UN REPORT DATE: 07/26/2024 4:00 PM CLINICAL INDICATION: 68 years old with eval for stones  COMPARISON: Ultrasound renal 06/08/2023, CTA abdomen/pelvis 10/16/2021 TECHNIQUE: Static and cine images of the kidneys and bladder were performed. FINDINGS: KIDNEYS: Normal size and mildly increased echogenicity. 0.8 cm nonobstructive calculus of the interpolar  right kidney. No solid masses. No hydronephrosis.      Right kidney: 11.2 cm      Left kidney: 11.6 cm BLADDER: Moderately distended bladder, similar to prior.      Bladder volume prevoid: 483.7 mL         -0.8 cm nonobstructive right renal calculus. -No evidence of hydronephrosis.   XR Chest 2 views Result Date: 07/26/2024 EXAM: XR CHEST 2 VIEWS ACCESSION: [id number] UN REPORT DATE: 07/26/2024 10:17 AM CLINICAL INDICATION: SHORTNESS OF BREATH  TECHNIQUE: PA and Lateral Chest Radiographs COMPARISON: XR CHEST PORTABLE 06/10/2023, XR CHEST PORTABLE 06/08/2023 FINDINGS: Mild interstitial pulmonary edema is present. No pleural effusion or pneumothorax. Normal heart size and mediastinal contours.   Mild pulmonary edema.   ______________________________________________________________________ Discharge Instructions:      Other Instructions     Call MD for:     More than two pound weight gain in a week   Call MD for:  difficulty breathing, headache or visual disturbances     Call MD for:   extreme fatigue     Call MD for: Temperature > 38.5 Celsius ( > 101.3 Fahrenheit)     Discharge instructions     Follow up with nephrology as scheduled for next week. We have started a new medicine semalever to control high phosphorous level in your blood due to kidney disease We have increased your fluid medicine torsemide from 20 mg to 60 mg daily  Please weigh yourself daily and record your weight. Contact your main doctor or kidney doctor for more than two pound weight gain.   Discharge instructions to patient: Call your primary care doctor and make an appointment to see them:     Within 2 weeks from the time you are discharged from the hospital       Follow Up instructions and Outpatient Referrals    Call MD for:     Call MD for:  difficulty breathing, headache or visual disturbances     Call MD for:  extreme fatigue     Call MD for: Temperature > 38.5 Celsius ( > 101.3 Fahrenheit)     Discharge instructions       Appointments which have been scheduled for you    Aug 03, 2024 4:00 PM (Arrive by 3:45 PM) RETURN GENERAL with Selina Pons, MD [facility]) [address] [place] [address] [phone]     Aug 13, 2024 11:30 AM (Arrive by 11:15 AM) RETURN PODIATRY with Garnette Lonni Craven, DPM [facility] [place]) [address] [place] [address] [phone]        ______________________________________________________________________ Discharge Day Services: BP 159/69 Comment: Dara,RN notified  Pulse 66   Temp 36.9 C (98.4 F) (Oral)   Resp 18   Ht 162.6 cm (5' 4)   Wt 89.3 kg (196 lb 12.8 oz)   SpO2 96%   BMI 33.78 kg/m  Pt seen on the day of discharge and determined appropriate for discharge.  Condition at Discharge: fair  Length of Discharge: I spent greater than 30 mins in  the discharge of this patient.

## 2024-07-30 NOTE — Progress Notes (Deleted)
 08/02/2024 8:17 AM   Angel Freeman Her [birth year] [id number]  Referring provider: Derick Leita POUR, MD [address] [place],  [place] [address]  Urological history: 1. Urinary retention - Foley was placed during hospitalization for ileitis for PVR of 350 mL, he subsequently passed a voiding trial  2. Balanitis - Lotrisone  cream   3. BPH with LU TS - tamsulosin  0.4 mg daily   4.  Nephrolithiasis - Noncontrast study (03/2024) 6 mm right renal stone   No chief complaint on file.  HPI: Angel Freeman is a 68 y.o. man with ESRD who presents today for 3 month follow up for symptom recheck and PVR.   Previous records reviewed.  He was seen by Dr. Francisca in April 20, 2024 for symptoms of dysuria and balanitis.  His oxybutynin was discontinued and he was instructed to continue the tamsulosin .  He was also given a 1 week course of nitrofurantoin  for the dysuria and instructed to return in 3 months for PVR and symptom recheck.  He was recently admitted to the hospital for for AKI and was started dialysis. ***  I PSS ***  He reports sensation of incomplete bladder emptying, urinary frequency, urinary intermittency, urinary urgency, a weak urinary stream, having to strain to void, nocturia x ***, leaking before being able to reach the restroom, leaking with coughing, leaking without awareness, and post void dribbling.     He is wearing *** pads//depends  daily.    Patient denies any modifying or aggravating factors.  Patient denies any recent UTI's, gross hematuria, dysuria or suprapubic/flank pain.  Patient denies any fevers, chills, nausea or vomiting.  ***  He has a family history of PCa, colon cancer, ovarian cancer and/or breast cancer with ***.   He does not have a family history of PCa, colon cancer, ovarian cancer, and/or breast cancer .***     UA (07/2024) bland  PVR***  Serum creatinine (07/2024) 4.66, eGFR 13  Hemoglobin A1c (06/2024) 7.4  Diuretics: Furosemide 20 mg  daily  Fluid consumptiom: ***  PMH: Past Medical History:  Diagnosis Date   Arthritis    ankles   Brain aneurysm 1995   Complication of anesthesia 01-26-2019   Died on table during retina surgery   Diabetes mellitus, type 2 (HCC)    GERD (gastroesophageal reflux disease)    Hypertension    Poor circulation    legs   Seizure (HCC) 1995   with brain aneurysm.  None since.   Sleep apnea    no CPAP (yet)   Wears dentures    full upper    Surgical History: Past Surgical History:  Procedure Laterality Date   ANEURYSM COILING  1996   CATARACT EXTRACTION W/PHACO Left 05/01/2020   Procedure: CATARACT EXTRACTION PHACO AND INTRAOCULAR LENS PLACEMENT (IOC) LEFT INTRAVITREAL KENALOG  INJECTION DIABETIC 1.97  00:26.0;  Surgeon: Myrna Adine Anes, MD;  Location: [facility];  Service: Ophthalmology;  Laterality: Left;  Diabetic - oral meds   intercranial coiling     RETINAL DETACHMENT SURGERY  01/26/19    Home Medications:  Allergies as of 08/02/2024       Reactions   Codeine Rash, Dermatitis, Itching   Per pt when he takes alot Per pt when he takes a lot; states oxycodone  is ok        Medication List        Accurate as of July 30, 2024  8:17 AM. If you have any questions, ask your nurse or  doctor.          albuterol  108 (90 Base) MCG/ACT inhaler Commonly known as: VENTOLIN  HFA Inhale into the lungs every 6 (six) hours as needed for wheezing or shortness of breath.   amLODipine  10 MG tablet Commonly known as: NORVASC  Take 10 mg by mouth daily.   ARIPiprazole  5 MG tablet Commonly known as: ABILIFY  Take 5 mg by mouth daily.   Breztri  Aerosphere 160-9-4.8 MCG/ACT Aero inhaler Generic drug: budesonide -glycopyrrolate -formoterol  Inhale 2 puffs into the lungs in the morning and at bedtime.   busPIRone  10 MG tablet Commonly known as: BUSPAR  Take 10 mg by mouth 2 (two) times daily.   clotrimazole -betamethasone  cream Commonly known as:  LOTRISONE  Apply 1 Application topically 2 (two) times daily.   diphenhydramine -acetaminophen  25-500 MG Tabs tablet Commonly known as: TYLENOL  PM Take 2 tablets by mouth at bedtime as needed.   empagliflozin 25 MG Tabs tablet Commonly known as: JARDIANCE Take 1 tablet by mouth daily.   fluticasone  50 MCG/ACT nasal spray Commonly known as: FLONASE  Place into both nostrils daily as needed for allergies or rhinitis.   gabapentin  300 MG capsule Commonly known as: NEURONTIN  Take 300 mg by mouth 2 (two) times daily. 300 mg at lunchtime.  600 mg at bedtime   hydrALAZINE  10 MG tablet Commonly known as: APRESOLINE  Take 10 mg by mouth 2 (two) times daily.   losartan  50 MG tablet Commonly known as: COZAAR  Take 2 tablets (100 mg total) by mouth daily.   metFORMIN 1000 MG tablet Commonly known as: GLUCOPHAGE Take 1,000 mg by mouth 2 (two) times daily.   metoprolol succinate 100 MG 24 hr tablet Commonly known as: TOPROL-XL Take 100 mg by mouth daily.   nitrofurantoin  (macrocrystal-monohydrate) 100 MG capsule Commonly known as: MACROBID  Take 1 capsule (100 mg total) by mouth every 12 (twelve) hours.   nitroGLYCERIN  0.4 MG SL tablet Commonly known as: NITROSTAT  Place 0.4 mg under the tongue every 5 (five) minutes as needed.   omeprazole 20 MG capsule Commonly known as: PRILOSEC Take 20 mg by mouth daily.   phenylephrine 1 % nasal spray Commonly known as: NEO-SYNEPHRINE Place 1 drop into both nostrils every 6 (six) hours as needed for congestion. [employer] - 4 way nose spray   prochlorperazine 10 MG tablet Commonly known as: COMPAZINE Take 10 mg by mouth every 8 (eight) hours as needed for nausea.   rosuvastatin  40 MG tablet Commonly known as: CRESTOR  Take 1 tablet by mouth daily.   senna-docusate 8.6-50 MG tablet Commonly known as: Senokot-S Take 2 tablets by mouth 2 (two) times daily as needed for mild constipation.   sertraline  25 MG tablet Commonly known as:  ZOLOFT  Take 25 mg by mouth in the morning and at bedtime.   sitaGLIPtin 50 MG tablet Commonly known as: JANUVIA Take 50 mg by mouth daily.   tamsulosin  0.4 MG Caps capsule Commonly known as: FLOMAX  Take 1 capsule (0.4 mg total) by mouth daily.   Trulicity 4.5 MG/0.5ML Soaj Generic drug: Dulaglutide        Allergies:  Allergies  Allergen Reactions   Codeine Rash, Dermatitis and Itching    Per pt when he takes alot  Per pt when he takes a lot; states oxycodone  is ok    Family History: Family History  Problem Relation Age of Onset   Cancer Father    Macular degeneration Father     Social History:  reports that he has never smoked. He has never used smokeless tobacco. He reports  current alcohol use. He reports that he does not currently use drugs.  ROS: Pertinent ROS in HPI  Physical Exam: There were no vitals taken for this visit.  Constitutional:  Well nourished. Alert and oriented, No acute distress. HEENT: [place] AT, moist mucus membranes.  Trachea midline, no masses. Cardiovascular: No clubbing, cyanosis, or edema. Respiratory: Normal respiratory effort, no increased work of breathing. GI: Abdomen is soft, non tender, non distended, no abdominal masses. Liver and spleen not palpable.  No hernias appreciated.  Stool sample for occult testing is not indicated.   GU: No CVA tenderness.  No bladder fullness or masses.  Patient with circumcised/uncircumcised phallus. ***Foreskin easily retracted***  Urethral meatus is patent.  No penile discharge. No penile lesions or rashes. Scrotum without lesions, cysts, rashes and/or edema.  Testicles are located scrotally bilaterally. No masses are appreciated in the testicles. Left and right epididymis are normal. Rectal: Patient with  normal sphincter tone. Anus and perineum without scarring or rashes. No rectal masses are appreciated. Prostate is approximately *** grams, *** nodules are appreciated. Seminal vesicles are normal. Skin:  No rashes, bruises or suspicious lesions. Lymph: No cervical or inguinal adenopathy. Neurologic: Grossly intact, no focal deficits, moving all 4 extremities. Psychiatric: Normal mood and affect.  Laboratory Data: See HPI and EPIC I have reviewed the labs.   Pertinent Imaging: ***  Assessment & Plan:  ***  1. BPH with LU TS - stable, improving, worsening mild, moderate severe symptoms *** - DRE benign *** - UA benign *** - PVR < 300 cc *** - most bothersome symptoms are *** - encouraged avoiding bladder irritants, fluid restriction before bedtime and timed voiding's - Initiate alpha-blocker (***), discussed side effects *** - Initiate 5 alpha reductase inhibitor (***), discussed side effects *** - Continue tamsulosin  0.4 mg daily, alfuzosin 10 mg daily, Rapaflo 8 mg daily, terazosin, doxazosin, Cialis 5 mg daily and finasteride 5 mg daily, dutasteride 0.5 mg daily***:refills given - Cannot tolerate medication or medication failure, schedule cystoscopy *** - educated on red flag symptoms: acute retention, gross hematuria, fever, severe pain - advised to call clinic or go to the ED if these occur - return to clinic in *** symptom re-evaluation ***  2. Balanitis - resolved      No follow-ups on file.  These notes generated with voice recognition software. I apologize for typographical errors.  CLOTILDA HELON RIGGERS  [facility] [address]  [address] [place], [place] [address] [phone]

## 2024-08-02 ENCOUNTER — Encounter: Payer: Self-pay | Admitting: Urology

## 2024-08-02 ENCOUNTER — Ambulatory Visit: Admitting: Urology

## 2024-08-02 DIAGNOSIS — N138 Other obstructive and reflux uropathy: Secondary | ICD-10-CM

## 2024-08-02 DIAGNOSIS — N481 Balanitis: Secondary | ICD-10-CM
